# Patient Record
Sex: Female | Born: 1950 | Race: White | Hispanic: No | Marital: Married | Smoking: Former smoker
Health system: Southern US, Community
[De-identification: ages and names within clinical notes are randomized; demographics above are authoritative.]

## PROBLEM LIST (undated history)

## (undated) DIAGNOSIS — M797 Fibromyalgia: Secondary | ICD-10-CM

## (undated) DIAGNOSIS — I10 Essential (primary) hypertension: Secondary | ICD-10-CM

## (undated) DIAGNOSIS — E119 Type 2 diabetes mellitus without complications: Secondary | ICD-10-CM

## (undated) DIAGNOSIS — K760 Fatty (change of) liver, not elsewhere classified: Secondary | ICD-10-CM

## (undated) DIAGNOSIS — G43909 Migraine, unspecified, not intractable, without status migrainosus: Secondary | ICD-10-CM

## (undated) DIAGNOSIS — E7800 Pure hypercholesterolemia, unspecified: Secondary | ICD-10-CM

## (undated) DIAGNOSIS — G629 Polyneuropathy, unspecified: Secondary | ICD-10-CM

## (undated) DIAGNOSIS — M810 Age-related osteoporosis without current pathological fracture: Secondary | ICD-10-CM

## (undated) DIAGNOSIS — R011 Cardiac murmur, unspecified: Secondary | ICD-10-CM

## (undated) DIAGNOSIS — G4730 Sleep apnea, unspecified: Secondary | ICD-10-CM

## (undated) HISTORY — PX: KNEE ARTHROSCOPY: SUR90

## (undated) HISTORY — DX: Polyneuropathy, unspecified: G62.9

## (undated) HISTORY — DX: Fatty (change of) liver, not elsewhere classified: K76.0

## (undated) HISTORY — DX: Fibromyalgia: M79.7

## (undated) HISTORY — PX: TOTAL HIP ARTHROPLASTY: SHX124

## (undated) HISTORY — DX: Age-related osteoporosis without current pathological fracture: M81.0

## (undated) HISTORY — PX: LYMPH NODE BIOPSY: SHX201

## (undated) HISTORY — PX: CHOLECYSTECTOMY: SHX55

## (undated) HISTORY — PX: OTHER SURGICAL HISTORY: SHX169

## (undated) HISTORY — PX: CATARACT EXTRACTION: SUR2

---

## 1980-11-07 HISTORY — PX: CHOLECYSTECTOMY: SHX55

## 2003-11-08 HISTORY — PX: LYMPH NODE BIOPSY: SHX201

## 2016-08-01 DIAGNOSIS — I10 Essential (primary) hypertension: Secondary | ICD-10-CM | POA: Insufficient documentation

## 2016-12-05 DIAGNOSIS — E1165 Type 2 diabetes mellitus with hyperglycemia: Secondary | ICD-10-CM | POA: Insufficient documentation

## 2016-12-06 ENCOUNTER — Other Ambulatory Visit: Payer: Self-pay | Admitting: Pediatrics

## 2016-12-06 DIAGNOSIS — Z1239 Encounter for other screening for malignant neoplasm of breast: Secondary | ICD-10-CM

## 2018-08-23 DIAGNOSIS — M2550 Pain in unspecified joint: Secondary | ICD-10-CM | POA: Insufficient documentation

## 2018-11-13 DIAGNOSIS — M1990 Unspecified osteoarthritis, unspecified site: Secondary | ICD-10-CM | POA: Insufficient documentation

## 2018-11-27 ENCOUNTER — Ambulatory Visit: Payer: Medicare Other | Admitting: Physical Therapy

## 2018-12-03 ENCOUNTER — Encounter: Payer: Medicare Other | Admitting: Physical Therapy

## 2018-12-05 ENCOUNTER — Encounter: Payer: Medicare Other | Admitting: Physical Therapy

## 2018-12-10 ENCOUNTER — Encounter: Payer: Medicare Other | Admitting: Physical Therapy

## 2018-12-13 ENCOUNTER — Encounter: Payer: Medicare Other | Admitting: Physical Therapy

## 2018-12-17 ENCOUNTER — Encounter: Payer: Medicare Other | Admitting: Physical Therapy

## 2018-12-20 ENCOUNTER — Encounter: Payer: Medicare Other | Admitting: Physical Therapy

## 2018-12-24 ENCOUNTER — Encounter: Payer: Medicare Other | Admitting: Physical Therapy

## 2018-12-27 ENCOUNTER — Encounter: Payer: Medicare Other | Admitting: Physical Therapy

## 2018-12-31 ENCOUNTER — Encounter: Payer: Medicare Other | Admitting: Physical Therapy

## 2019-01-01 ENCOUNTER — Other Ambulatory Visit (HOSPITAL_COMMUNITY): Payer: Self-pay | Admitting: Neurology

## 2019-01-01 ENCOUNTER — Other Ambulatory Visit: Payer: Self-pay | Admitting: Neurology

## 2019-01-01 DIAGNOSIS — M6289 Other specified disorders of muscle: Secondary | ICD-10-CM

## 2019-01-03 ENCOUNTER — Encounter: Payer: Medicare Other | Admitting: Physical Therapy

## 2019-01-09 ENCOUNTER — Ambulatory Visit
Admission: RE | Admit: 2019-01-09 | Discharge: 2019-01-09 | Disposition: A | Discharge status: Home / Self Care | Payer: Medicare Other | Source: Ambulatory Visit | Attending: Neurology | Admitting: Neurology

## 2019-01-09 ENCOUNTER — Other Ambulatory Visit: Payer: Self-pay

## 2019-01-09 DIAGNOSIS — M6289 Other specified disorders of muscle: Secondary | ICD-10-CM | POA: Diagnosis present

## 2019-05-27 DIAGNOSIS — E041 Nontoxic single thyroid nodule: Secondary | ICD-10-CM | POA: Insufficient documentation

## 2019-12-12 ENCOUNTER — Emergency Department: Payer: Medicare Other

## 2019-12-12 ENCOUNTER — Other Ambulatory Visit: Payer: Self-pay

## 2019-12-12 ENCOUNTER — Encounter: Payer: Self-pay | Admitting: Emergency Medicine

## 2019-12-12 ENCOUNTER — Emergency Department
Admission: EM | Admit: 2019-12-12 | Discharge: 2019-12-12 | Disposition: A | Discharge status: Home / Self Care | Payer: Medicare Other | Attending: Emergency Medicine | Admitting: Emergency Medicine

## 2019-12-12 DIAGNOSIS — Z7982 Long term (current) use of aspirin: Secondary | ICD-10-CM | POA: Diagnosis not present

## 2019-12-12 DIAGNOSIS — R221 Localized swelling, mass and lump, neck: Secondary | ICD-10-CM | POA: Insufficient documentation

## 2019-12-12 DIAGNOSIS — T50Z95A Adverse effect of other vaccines and biological substances, initial encounter: Secondary | ICD-10-CM | POA: Diagnosis not present

## 2019-12-12 DIAGNOSIS — Z79899 Other long term (current) drug therapy: Secondary | ICD-10-CM | POA: Insufficient documentation

## 2019-12-12 LAB — BASIC METABOLIC PANEL
Anion gap: 11 (ref 5–15)
BUN: 19 mg/dL (ref 8–23)
CO2: 26 mmol/L (ref 22–32)
Calcium: 10 mg/dL (ref 8.9–10.3)
Chloride: 98 mmol/L (ref 98–111)
Creatinine, Ser: 1 mg/dL (ref 0.44–1.00)
GFR calc Af Amer: >60 mL/min (ref >60)
GFR calc non Af Amer: 58 mL/min — ABNORMAL LOW (ref >60)
Glucose, Bld: 164 mg/dL — ABNORMAL HIGH (ref 70–99)
Potassium: 4.2 mmol/L (ref 3.5–5.1)
Sodium: 135 mmol/L (ref 135–145)

## 2019-12-12 LAB — CBC WITH DIFFERENTIAL/PLATELET
Abs Immature Granulocytes: 0.01 10*3/uL (ref 0.00–0.07)
Basophils Absolute: 0.1 10*3/uL (ref 0.0–0.1)
Basophils Relative: 1 %
Eosinophils Absolute: 0.2 10*3/uL (ref 0.0–0.5)
Eosinophils Relative: 3 %
HCT: 39.5 % (ref 36.0–46.0)
Hemoglobin: 13.6 g/dL (ref 12.0–15.0)
Immature Granulocytes: 0 %
Lymphocytes Relative: 28 %
Lymphs Abs: 1.8 10*3/uL (ref 0.7–4.0)
MCH: 30.9 pg (ref 26.0–34.0)
MCHC: 34.4 g/dL (ref 30.0–36.0)
MCV: 89.8 fL (ref 80.0–100.0)
Monocytes Absolute: 0.4 10*3/uL (ref 0.1–1.0)
Monocytes Relative: 7 %
Neutro Abs: 4 10*3/uL (ref 1.7–7.7)
Neutrophils Relative %: 61 %
Platelets: 213 10*3/uL (ref 150–400)
RBC: 4.4 MIL/uL (ref 3.87–5.11)
RDW: 13.5 % (ref 11.5–15.5)
WBC: 6.4 10*3/uL (ref 4.0–10.5)
nRBC: 0 % (ref 0.0–0.2)

## 2019-12-12 MED ORDER — DEXAMETHASONE SODIUM PHOSPHATE 10 MG/ML IJ SOLN: 10.0000 mg | Freq: Once | INTRAMUSCULAR | Status: DC

## 2019-12-12 NOTE — ED Provider Notes (Signed)
[facility] Emergency Department Provider Note  ____________________________________________   First MD Initiated Contact with Patient 12/12/19 1625     (approximate)  I have reviewed the triage vital signs and the nursing notes.   HISTORY  Chief Complaint Allergic Reaction    HPI Brittany Cervantes is a 69 y.o. female presents emergency department stating that she may be having a reaction to the Covid vaccine.  Patient had a Covid vaccine yesterday.  She is currently taking the antiviral, antibiotic for a eye infection.  She states that today of the left side of her neck is swollen.  She states she had a similar type reaction to anesthesia.  She denies any tongue swelling, difficulty breathing, or rash.  She did take Benadryl which is helped a little.  Her regular doctor told her to come to ED for evaluation.    History reviewed. No pertinent past medical history.  There are no problems to display for this patient.   History reviewed. No pertinent surgical history.  Prior to Admission medications   Medication Sig Start Date End Date Taking? Authorizing Provider  amLODipine (NORVASC) 5 MG tablet Take 5 mg by mouth daily.   Yes [provider]  amoxicillin-clavulanate (AUGMENTIN) 875-125 MG tablet Take 1 tablet by mouth 2 (two) times daily.   Yes [provider]  aspirin EC 81 MG tablet Take 81 mg by mouth daily.   Yes [provider]  azelastine (ASTELIN) 0.1 % nasal spray Place 1 spray into both nostrils 2 (two) times daily. Use in each nostril as directed   Yes [provider]  furosemide (LASIX) 20 MG tablet Take 20 mg by mouth.   Yes [provider]  gabapentin (NEURONTIN) 300 MG capsule Take 300 mg by mouth 3 (three) times daily.   Yes [provider]  glimepiride (AMARYL) 4 MG tablet Take 4 mg by mouth daily with breakfast.   Yes [provider]  hydrochlorothiazide (HYDRODIURIL) 25 MG  tablet Take 25 mg by mouth daily.   Yes [provider]    Allergies Contrast media [iodinated diagnostic agents] and Duloxetine  No family history on file.  Social History Social History   Tobacco Use  . Smoking status: Not on file  Substance Use Topics  . Alcohol use: Not on file  . Drug use: Not on file    Review of Systems  Constitutional: No fever/chills Eyes: No visual changes. ENT: No sore throat.  Left side of neck swelling Respiratory: Denies cough Cardiovascular: Denies chest pain Gastrointestinal: Denies abdominal pain Genitourinary: Negative for dysuria. Musculoskeletal: Negative for back pain. Skin: Negative for rash. Psychiatric: no mood changes,     ____________________________________________   PHYSICAL EXAM:  VITAL SIGNS: ED Triage Vitals  Enc Vitals Group     BP 12/12/19 1613 (!) 148/70     Pulse Rate 12/12/19 1613 62     Resp 12/12/19 1613 17     Temp 12/12/19 1613 97.8 F (36.6 C)     Temp Source 12/12/19 1613 Oral     SpO2 12/12/19 1613 96 %     Weight 12/12/19 1612 215 lb (97.5 kg)     Height 12/12/19 1612 5\' 8"  (1.727 m)     Head Circumference --      Peak Flow --      Pain Score 12/12/19 1621 0     Pain Loc --      Pain Edu? --      Excl.  in [place]? --     Constitutional: Alert and oriented. Well appearing and in no acute distress. Eyes: Conjunctivae are normal.  Head: Atraumatic. Nose: No congestion/rhinnorhea. Mouth/Throat: Mucous membranes are moist.  Lips are normal sized, patent airway Neck:  supple no lymphadenopathy noted, enlargement noted in the soft tissues of the left side of the neck, no rash Cardiovascular: Normal rate, regular rhythm. Heart sounds are normal Respiratory: Normal respiratory effort.  No retractions, lungs c t a  GU: deferred Musculoskeletal: FROM all extremities, warm and well perfused Neurologic:  Normal speech and language.  Skin:  Skin is warm, dry and intact. No rash noted. Psychiatric:  Mood and affect are normal. Speech and behavior are normal.  ____________________________________________   LABS (all labs ordered are listed, but only abnormal results are displayed)  Labs Reviewed  BASIC METABOLIC PANEL - Abnormal; Notable for the following components:      Result Value   Glucose, Bld 164 (*)    GFR calc non Af Amer 58 (*)    All other components within normal limits  CBC WITH DIFFERENTIAL/PLATELET   ____________________________________________   ____________________________________________  RADIOLOGY  CT soft tissue neck without contrast as patient has a contrast allergy is negative  ____________________________________________   PROCEDURES  Procedure(s) performed: No  Procedures    ____________________________________________   INITIAL IMPRESSION / ASSESSMENT AND PLAN / ED COURSE  Pertinent labs & imaging results that were available during my care of the patient were reviewed by me and considered in my medical decision making (see chart for details).   The patient 69 year old female presents emergency department with concerns of a reaction to the Covid vaccine.  See HPI  Physical exam shows the left side of the neck to be swollen.  Explained the findings to the patient.  I do feel that a CT soft tissue of the neck would be appropriate at this time to rule out Ludwig's angina.  CBC is normal, basic metabolic panel has increased glucose of 164, patient is diabetic so this is her norm  CT soft tissue of the neck without contrast is negative  I explained the findings to the patient.  Patient prefers not to do Decadron as she feels that the Benadryl is working.  States the size of the swelling has decreased since she took 50 mg of Benadryl prior to arrival.  I agree that less is better.  Feel that if she is comfortable taking Benadryl this would be appropriate at this time.  Patient was given strict instructions to return if she is worsening.   Explained to her that she may want to discuss this with her regular doctor prior to get the second part of the vaccine.  She states she understands and will comply.  She was discharged in stable condition.    Brittany Cervantes was evaluated in Emergency Department on 12/12/2019 for the symptoms described in the history of present illness. She was evaluated in the context of the global COVID-19 pandemic, which necessitated consideration that the patient might be at risk for infection with the SARS-CoV-2 virus that causes COVID-19. Institutional protocols and algorithms that pertain to the evaluation of patients at risk for COVID-19 are in a state of rapid change based on information released by regulatory bodies including the CDC and federal and state organizations. These policies and algorithms were followed during the patient's care in the ED.   As part of my medical decision making, I reviewed the following data within the [facility]  Nursing notes reviewed and incorporated, Labs reviewed see above, Old chart reviewed, Radiograph reviewed CT negative, Notes from prior ED visits and [place] Controlled Substance Database  ____________________________________________   FINAL CLINICAL IMPRESSION(S) / ED DIAGNOSES  Final diagnoses:  Neck swelling  Adverse effect of vaccine, initial encounter      NEW MEDICATIONS STARTED DURING THIS VISIT:  New Prescriptions   No medications on file     Note:  This document was prepared using Dragon voice recognition software and may include unintentional dictation errors.    Versie Starks, PA-C 12/12/19 1952    Harvest Dark, MD 12/12/19 [phone]

## 2019-12-12 NOTE — ED Notes (Signed)
Pt states that she has had swelling to her left side of neck and into her face, but that she took a benadryl and the swelling has improved.

## 2019-12-12 NOTE — ED Triage Notes (Signed)
Pt presents to ED states Covid vaccine yesterday, states last night felt like swelling to her neck and L side of her face. Pt states "the pouch" to her L side of her neck/chin area is normal. Pt states "it didn't look swollen but it felt swollen". Pt states relief of symptoms with benadryl.

## 2019-12-12 NOTE — ED Triage Notes (Signed)
Patient presents to the ED with slight swelling to the left side of her neck.  Patient had the covid19 vaccine yesterday.  Patient denies difficulty speaking or swallowing.  Patient took 50mg  of benadryl prior to arrival.  Patient states she feels like swelling has been getting slowly worse over the course of the day.

## 2019-12-13 ENCOUNTER — Other Ambulatory Visit: Payer: Self-pay

## 2019-12-13 ENCOUNTER — Emergency Department
Admission: EM | Admit: 2019-12-13 | Discharge: 2019-12-13 | Disposition: A | Discharge status: Home / Self Care | Payer: Medicare Other | Attending: Student | Admitting: Student

## 2019-12-13 DIAGNOSIS — Z7984 Long term (current) use of oral hypoglycemic drugs: Secondary | ICD-10-CM | POA: Insufficient documentation

## 2019-12-13 DIAGNOSIS — Z79899 Other long term (current) drug therapy: Secondary | ICD-10-CM | POA: Diagnosis not present

## 2019-12-13 DIAGNOSIS — I10 Essential (primary) hypertension: Secondary | ICD-10-CM | POA: Diagnosis not present

## 2019-12-13 DIAGNOSIS — E119 Type 2 diabetes mellitus without complications: Secondary | ICD-10-CM | POA: Diagnosis not present

## 2019-12-13 DIAGNOSIS — Z7982 Long term (current) use of aspirin: Secondary | ICD-10-CM | POA: Insufficient documentation

## 2019-12-13 DIAGNOSIS — R1310 Dysphagia, unspecified: Secondary | ICD-10-CM

## 2019-12-13 HISTORY — DX: Pure hypercholesterolemia, unspecified: E78.00

## 2019-12-13 HISTORY — DX: Sleep apnea, unspecified: G47.30

## 2019-12-13 HISTORY — DX: Cardiac murmur, unspecified: R01.1

## 2019-12-13 HISTORY — DX: Essential (primary) hypertension: I10

## 2019-12-13 HISTORY — DX: Type 2 diabetes mellitus without complications: E11.9

## 2019-12-13 HISTORY — DX: Migraine, unspecified, not intractable, without status migrainosus: G43.909

## 2019-12-13 MED ORDER — SODIUM CHLORIDE 0.9 % IV SOLN: 40.0000 mg | Freq: Once | INTRAVENOUS | Status: DC

## 2019-12-13 MED ORDER — FAMOTIDINE IN NACL 20-0.9 MG/50ML-% IV SOLN
20.0000 mg | Freq: Once | INTRAVENOUS | Status: AC
  Administered 2019-12-13: 20 mg via INTRAVENOUS
  Filled 2019-12-13: qty 50

## 2019-12-13 MED ORDER — PREDNISONE 20 MG PO TABS: 40.0000 mg | ORAL_TABLET | Freq: Every day | ORAL | 0 refills | Status: AC

## 2019-12-13 MED ORDER — METHYLPREDNISOLONE SODIUM SUCC 125 MG IJ SOLR
125.0000 mg | Freq: Once | INTRAMUSCULAR | Status: AC
  Administered 2019-12-13: 125 mg via INTRAVENOUS
  Filled 2019-12-13: qty 2

## 2019-12-13 NOTE — ED Notes (Signed)
MD at bedside. 

## 2019-12-13 NOTE — Discharge Instructions (Signed)
Thank you for letting us take care of you in the emergency department today.   Please continue to take any regular, prescribed medications.   New medications we have prescribed:  - Prednisone steroid burst. Keep an eye on your sugars while on this.  - Continue to take scheduled Benadryl for the next few days.  Please follow up with: - Your primary care doctor to review your ER visit and follow up on your symptoms.   Please return to the ER for any new or worsening symptoms.

## 2019-12-13 NOTE — ED Triage Notes (Addendum)
Pt comes via POV from home with c/o allergic reaction. Pt states she had her 1st vaccine on Wednesday around 4pm.  Pt states not long after she started to experience swelling around her mouth and throat.  Pt seen here in ED yesterday and sent home. Pt states she has been taking benadryl around the clock with no relief. Pt states she is starting to have trouble swallowing. Pt also states rash that she noticed this am on her right upper chest area. Pt states she feels shakey.

## 2019-12-13 NOTE — ED Provider Notes (Signed)
[facility] Emergency Department Provider Note  ____________________________________________   First MD Initiated Contact with Patient 12/13/19 1145     (approximate)  I have reviewed the triage vital signs and the nursing notes.  History  Chief Complaint Allergic Reaction    HPI Brittany Cervantes is a 69 y.o. female who presents to the emergency department with concern for possible allergic reaction to the COVID vaccine.  Patient states she received the vaccine on Wednesday.  Shortly afterward she felt like she had left-sided facial and neck swelling.  She was seen here, treated with Benadryl PTA, CT neck was negative, felt subjective improvement, and was discharged on a scheduled Benadryl regimen.   This morning when she woke up she felt like she was improving, however around 11 AM she began to feel like she had some trouble with swallowing.  She states she is able to swallow but that it feels different.  She denies any lip or tongue swelling.  No wheezing.  No nausea, vomiting, diarrhea.  No rash.  No difficulty breathing or speaking.  No voice changes.  She does report a number of allergies including allergies to prior anesthetics given in surgery with some similar reaction.   Past Medical Hx Past Medical History:  Diagnosis Date  . Diabetes mellitus without complication ([place])   . Heart murmur   . Hypercholesteremia   . Hypertension   . Migraines   . Sleep apnea     Problem List There are no problems to display for this patient.   Past Surgical Hx Prior incision site to the RIGHT submandibular/upper neck area.  Medications Prior to Admission medications   Medication Sig Start Date End Date Taking? Authorizing Provider  amLODipine (NORVASC) 5 MG tablet Take 5 mg by mouth daily.    [provider]  amoxicillin-clavulanate (AUGMENTIN) 875-125 MG tablet Take 1 tablet by mouth 2 (two) times daily.    [provider]  aspirin EC 81 MG  tablet Take 81 mg by mouth daily.    [provider]  azelastine (ASTELIN) 0.1 % nasal spray Place 1 spray into both nostrils 2 (two) times daily. Use in each nostril as directed    [provider]  furosemide (LASIX) 20 MG tablet Take 20 mg by mouth.    [provider]  gabapentin (NEURONTIN) 300 MG capsule Take 300 mg by mouth 3 (three) times daily.    [provider]  glimepiride (AMARYL) 4 MG tablet Take 4 mg by mouth daily with breakfast.    [provider]  hydrochlorothiazide (HYDRODIURIL) 25 MG tablet Take 25 mg by mouth daily.    [provider]    Allergies Contrast media [iodinated diagnostic agents] and Duloxetine  Family Hx No family history on file.  Social Hx Social History   Tobacco Use  . Smoking status: Not on file  Substance Use Topics  . Alcohol use: Not on file  . Drug use: Not on file     Review of Systems  Constitutional: Negative for fever, chills. Eyes: Negative for visual changes. ENT: Negative for sore throat. Cardiovascular: Negative for chest pain. Respiratory: Negative for shortness of breath. Gastrointestinal: Negative for nausea, vomiting. + trouble swallowing Genitourinary: Negative for dysuria. Musculoskeletal: Negative for leg swelling. Skin: Negative for rash. Neurological: Negative for headaches.   Physical Exam  Vital Signs: ED Triage Vitals  Enc Vitals Group     BP 12/13/19 1206 (!) 156/77     Pulse --  Resp --      Temp --      Temp src --      SpO2 12/13/19 1210 98 %     Weight 12/13/19 1148 215 lb (97.5 kg)     Height 12/13/19 1148 5\' 8"  (1.727 m)     Head Circumference --      Peak Flow --      Pain Score 12/13/19 1148 0     Pain Loc --      Pain Edu? --      Excl. in [place]? --     Constitutional: Alert and oriented.  Head: Normocephalic. Atraumatic. Eyes: Conjunctivae clear. Sclera anicteric. Nose: No congestion. No rhinorrhea. Mouth/Throat: Wearing mask.   No lip, tongue, uvular swelling.  No voice changes. No tongue elevation. No sublingual swelling. Neck: No stridor.  Asymmetry to the submandibular and upper neck tissue with more prominence on the LEFT, however this is in the setting of a prior submandibular surgical incision site on the RIGHT, which the patient states has given her chronic asymmetry since then.  No lymphadenopathy. Cardiovascular: Normal rate, regular rhythm. Extremities well perfused.  No wheezing. Respiratory: Normal respiratory effort.  Lungs CTAB. Gastrointestinal: Soft. Non-tender. Non-distended.  Musculoskeletal: No lower extremity edema. No deformities. Neurologic:  Normal speech and language. No gross focal neurologic deficits are appreciated. CN II-XII in tact.   Skin: Skin is warm, dry and intact. No rash noted.  No urticaria or hives. Psychiatric: Mood and affect are appropriate for situation.     Procedures  Procedure(s) performed (including critical care):  Procedures   Initial Impression / Assessment and Plan / ED Course  69 y.o. female who presents to the ED with concern for possible allergic reaction to vaccination on Wednesday.  Reports feeling changes in swallowing sensation.  On exam there is no evidence of lip, tongue, uvular swelling.  She exhibits no voice changes, stridor, wheezing, or rash. No evidence of infection. No evidence of facial palsy.  No evidence of anaphylaxis at this time.  Will give dose of steroids and Pepcid to complete allergy cocktail, patient is agreeable.  We will continue to monitor.  If remains stable, anticipate discharge with continued scheduled Benadryl and short steroid burst.  Patient remained stable after period of observation of approximately 3 hours.  No changes in exam.  She reports improvement in her symptoms.  As such, she is stable for discharge.  She is agreeable to a short Rx for steroids (advised monitoring her glucose during this time), scheduled Benadryl for  the next 24 to 48 hours.  Advised PCP follow-up.  Given return precautions.   Final Clinical Impression(s) / ED Diagnosis  Final diagnoses:  Swallowing problem       Note:  This document was prepared using Dragon voice recognition software and may include unintentional dictation errors.   Lilia Pro., MD 12/13/19 [phone]

## 2019-12-13 NOTE — ED Notes (Signed)
Pt states she had similar swelling following surgery x 2 years ago.

## 2020-11-12 ENCOUNTER — Other Ambulatory Visit: Payer: Self-pay

## 2020-11-12 ENCOUNTER — Ambulatory Visit
Admission: RE | Admit: 2020-11-12 | Discharge: 2020-11-12 | Disposition: A | Discharge status: Home / Self Care | Payer: Medicare Other | Source: Ambulatory Visit | Attending: Emergency Medicine | Admitting: Emergency Medicine

## 2020-11-12 VITALS — BP 124/62 | HR 60 | Temp 99.0°F | Resp 14 | Ht 68.0 in | Wt 220.0 lb

## 2020-11-12 DIAGNOSIS — Z20822 Contact with and (suspected) exposure to covid-19: Secondary | ICD-10-CM | POA: Insufficient documentation

## 2020-11-12 DIAGNOSIS — J069 Acute upper respiratory infection, unspecified: Secondary | ICD-10-CM | POA: Insufficient documentation

## 2020-11-12 LAB — SARS CORONAVIRUS 2 (TAT 6-24 HRS): SARS Coronavirus 2: NEGATIVE

## 2020-11-12 NOTE — Discharge Instructions (Addendum)
Quarantine for the next 4 days until you reach the 10-day mark.  Since your symptoms are continuing to improve and you have not had a fever after the 10 days you can break quarantine providing you are continuing to feel better.  You can continue to use Mucinex to help you expectorate mucus.  Use Tylenol and ibuprofen as needed for pain.  If you develop any worsening of symptoms, especially shortness of breath at rest, you cannot speak in full sentences, worried about blowing of your lips she need to go to the ER for evaluation.

## 2020-11-12 NOTE — ED Triage Notes (Signed)
Patient states that she has been having fever, diarrhea, fatigue, nasal congestion and runny nose x 6 days.   States that husband is positive for covid.

## 2020-11-12 NOTE — ED Provider Notes (Signed)
[facility]    CSN: [id number] Arrival date & time: 11/12/20  1142      History   Chief Complaint Chief Complaint  Patient presents with  . Appointment    [phone]    HPI Brittany Cervantes is a 70 y.o. female.   HPI   70 year old female here for evaluation of COVID symptoms.  Patient reports that 6 days ago she developed fever, diarrhea, runny nose and nasal congestion and fatigue.  Additionally she has had a headache, intermittent nonproductive cough, sinus pain and pressure, and some intermittent sweats.  Patient denies body aches, changes to her sense of taste or smell, shortness of breath or wheezing, ear pain or pressure or, sore throat.  Patient reports that all of her symptoms are resolving and her nasal congestion runny nose however resolved.  Past Medical History:  Diagnosis Date  . Diabetes mellitus without complication ([facility])   . Heart murmur   . Hypercholesteremia   . Hypertension   . Migraines   . Sleep apnea     There are no problems to display for this patient.   History reviewed. No pertinent surgical history.  OB History   No obstetric history on file.      Home Medications    Prior to Admission medications   Medication Sig Start Date End Date Taking? Authorizing Provider  amLODipine (NORVASC) 5 MG tablet Take 5 mg by mouth daily.   Yes [provider]  ascorbic acid (VITAMIN C) 1000 MG tablet Take by mouth.   Yes [provider]  aspirin EC 81 MG tablet Take 81 mg by mouth daily.   Yes [provider]  azelastine (ASTELIN) 0.1 % nasal spray Place 1 spray into both nostrils 2 (two) times daily. Use in each nostril as directed   Yes [provider]  citalopram (CELEXA) 10 MG tablet Take by mouth. 08/29/20 11/05/21 Yes [provider]  ferrous gluconate (FERGON) 324 MG tablet Take by mouth.   Yes [provider]  furosemide (LASIX) 20 MG tablet Take 20 mg by mouth.   Yes [provider]  gabapentin (NEURONTIN) 300 MG capsule Take 300 mg by mouth 3 (three) times daily.   Yes [provider]  glimepiride (AMARYL) 4 MG tablet Take 4 mg by mouth daily with breakfast.   Yes [provider]  hydrochlorothiazide (HYDRODIURIL) 25 MG tablet Take 25 mg by mouth daily.   Yes [provider]  Insulin Glargine (BASAGLAR KWIKPEN) 100 UNIT/ML Inject into the skin. 05/26/20  Yes [provider]  metFORMIN (GLUCOPHAGE) 1000 MG tablet Take 1 tablet by mouth 2 (two) times daily with a meal. 05/03/18  Yes [provider]  metoprolol tartrate (LOPRESSOR) 25 MG tablet Take by mouth. 03/05/18 09/02/21 Yes [provider]  rosuvastatin (CRESTOR) 20 MG tablet Take by mouth. 05/04/20  Yes [provider]  spironolactone (ALDACTONE) 25 MG tablet Take by mouth. 06/16/20  Yes [provider]    Family History History reviewed. No pertinent family history.  Social History Social History   Tobacco Use  . Smoking status: Never Smoker  . Smokeless tobacco: Never Used  Vaping Use  . Vaping Use: Never used  Substance Use Topics  . Alcohol use: Never  . Drug use: Never     Allergies   Covid-19 (mrna) vaccine, Losartan, Other, Contrast media [iodinated diagnostic agents], and Duloxetine   Review of Systems Review of Systems  Constitutional: Positive for diaphoresis, fatigue and fever.  Negative for activity change and appetite change.  HENT: Positive for congestion, rhinorrhea and sinus pressure. Negative for ear pain.   Respiratory: Positive for cough. Negative for shortness of breath and wheezing.   Gastrointestinal: Positive for diarrhea. Negative for nausea and vomiting.  Musculoskeletal: Negative for arthralgias and myalgias.  Skin: Negative for rash.  Neurological: Positive for headaches.  Hematological: Negative.   Psychiatric/Behavioral: Negative.      Physical Exam Triage Vital Signs ED Triage  Vitals [11/12/20 1209]  Enc Vitals Group     BP      Pulse      Resp      Temp      Temp src      SpO2      Weight 220 lb (99.8 kg)     Height 5\' 8"  (1.727 m)     Head Circumference      Peak Flow      Pain Score 3     Pain Loc      Pain Edu?      Excl. in [place]?    No data found.  Updated Vital Signs BP 124/62 (BP Location: Right Arm)   Pulse 60   Temp 99 F (37.2 C) (Oral)   Resp 14   Ht 5\' 8"  (1.727 m)   Wt 220 lb (99.8 kg)   SpO2 98%   BMI 33.45 kg/m   Visual Acuity Right Eye Distance:   Left Eye Distance:   Bilateral Distance:    Right Eye Near:   Left Eye Near:    Bilateral Near:     Physical Exam Vitals and nursing note reviewed.  Constitutional:      General: She is not in acute distress.    Appearance: Normal appearance. She is normal weight. She is not toxic-appearing.  HENT:     Head: Normocephalic and atraumatic.     Right Ear: Tympanic membrane, ear canal and external ear normal.     Left Ear: Tympanic membrane, ear canal and external ear normal.     Nose: Congestion and rhinorrhea present.     Mouth/Throat:     Pharynx: Oropharynx is clear. Posterior oropharyngeal erythema present.  Cardiovascular:     Rate and Rhythm: Normal rate.     Pulses: Normal pulses.     Heart sounds: Normal heart sounds. No murmur heard.   Pulmonary:     Effort: Pulmonary effort is normal.     Breath sounds: Normal breath sounds. No wheezing, rhonchi or rales.  Musculoskeletal:     Cervical back: Normal range of motion and neck supple.  Lymphadenopathy:     Cervical: Cervical adenopathy present.  Skin:    General: Skin is warm and dry.     Capillary Refill: Capillary refill takes less than 2 seconds.     Findings: No erythema or rash.  Neurological:     General: No focal deficit present.     Mental Status: She is alert and oriented to person, place, and time.  Psychiatric:        Mood and Affect: Mood normal.        Behavior: Behavior normal.         Thought Content: Thought content normal.        Judgment: Judgment normal.      UC Treatments / Results  Labs (all labs ordered are listed, but only abnormal results are displayed) Labs Reviewed  SARS CORONAVIRUS 2 (TAT 6-24 HRS)    EKG  Radiology No results found.  Procedures Procedures (including critical care time)  Medications Ordered in UC Medications - No data to display  Initial Impression / Assessment and Plan / UC Course  I have reviewed the triage vital signs and the nursing notes.  Pertinent labs & imaging results that were available during my care of the patient were reviewed by me and considered in my medical decision making (see chart for details).   Patient is here for evaluation of COVID-like symptoms that started 6 days ago and are improving.  Physical exam reveals some erythematous edematous nasal mucosa with clear nasal discharge.  Posterior oropharynx is mildly erythematous with some clear postnasal drip.  Shotty bilateral anterior cervical lymphadenopathy is present.  Lungs clear to auscultation all fields.  Patient has been taking over-the-counter Mucinex and it has been helping her to expectorate some clear mucus.  Patient is in no acute distress.  Patient only received 1 dose of Pfizer vaccine because she had an anaphylactic reaction to it.  Patient has had her flu vaccine.    We will send COVID swab charge patient home with supportive care.   Final Clinical Impressions(s) / UC Diagnoses   Final diagnoses:  Viral URI with cough     Discharge Instructions     Quarantine for the next 4 days until you reach the 10-day mark.  Since your symptoms are continuing to improve and you have not had a fever after the 10 days you can break quarantine providing you are continuing to feel better.  You can continue to use Mucinex to help you expectorate mucus.  Use Tylenol and ibuprofen as needed for pain.  If you develop any worsening of symptoms,  especially shortness of breath at rest, you cannot speak in full sentences, worried about blowing of your lips she need to go to the ER for evaluation.    ED Prescriptions    None     PDMP not reviewed this encounter.   Margarette Canada, NP 11/12/20 1248

## 2020-11-18 DIAGNOSIS — J301 Allergic rhinitis due to pollen: Secondary | ICD-10-CM | POA: Insufficient documentation

## 2020-11-18 DIAGNOSIS — K219 Gastro-esophageal reflux disease without esophagitis: Secondary | ICD-10-CM | POA: Insufficient documentation

## 2020-12-18 IMAGING — CT CT NECK W/O CM
4 of 5 series · 14 of 33 positions shown, 16 images · non-contrast
Comparison: None.

CLINICAL DATA: Left-sided neck swelling

EXAM:
CT NECK WITHOUT CONTRAST
TECHNIQUE: Multidetector CT imaging of the neck was performed following the
standard protocol without intravenous contrast.

[Series 5: sag neck · axial · 0.39mm/px · z∈[-292,-170]mm · 3 of 129 slices shown, 4 images]
[im 33/129  soft-tissue]
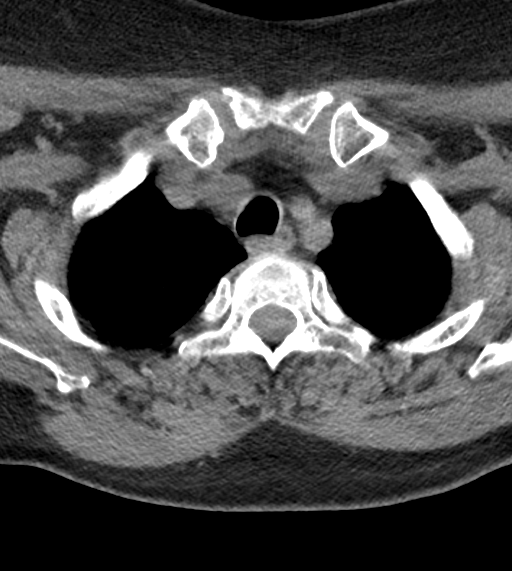
[im 33/129  bone]
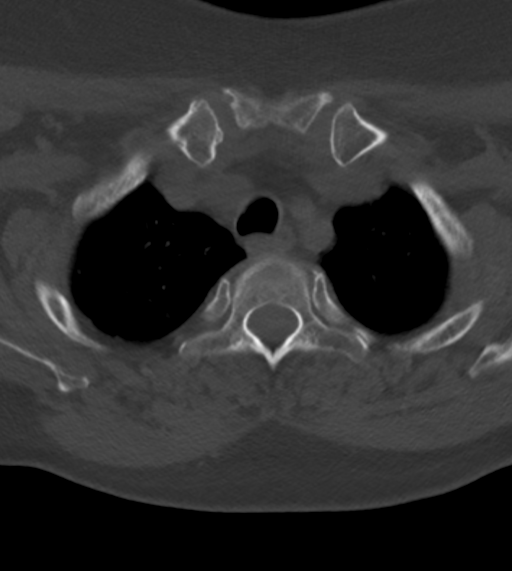
[im 65/129  bone]
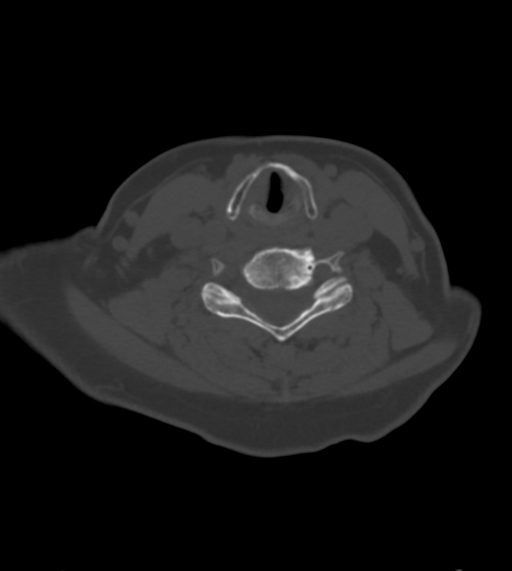
[im 97/129  bone]
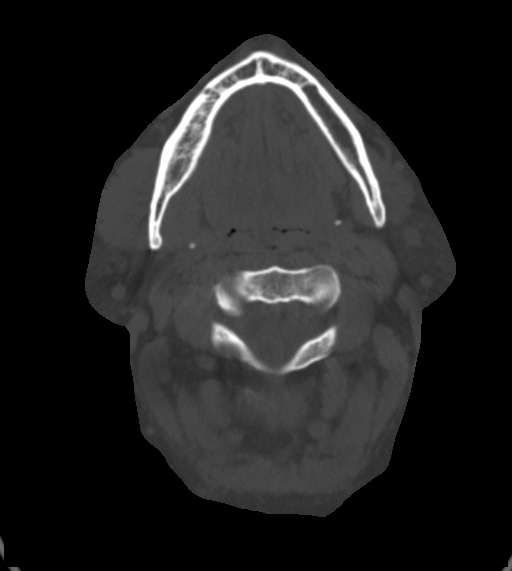

[Series 6: cor neck · coronal · 0.39mm/px · 3 of 112 slices shown]
[im 31/112  bone]
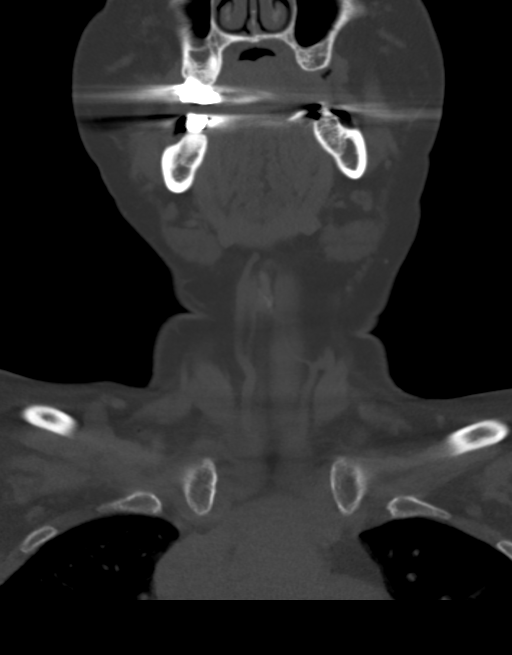
[im 48/112  bone]
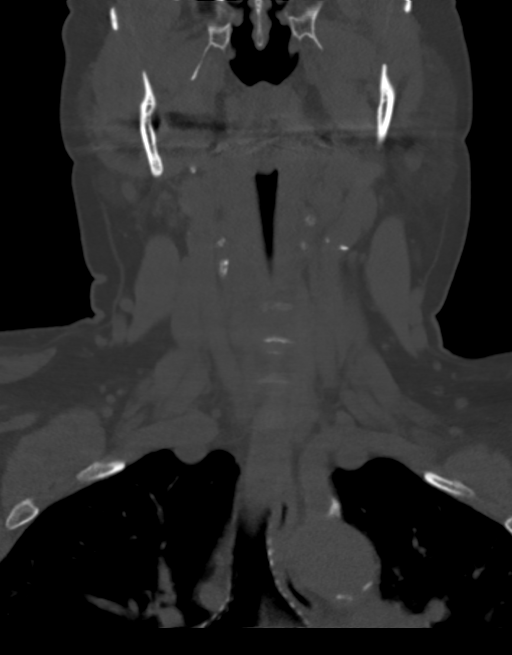
[im 64/112  bone]
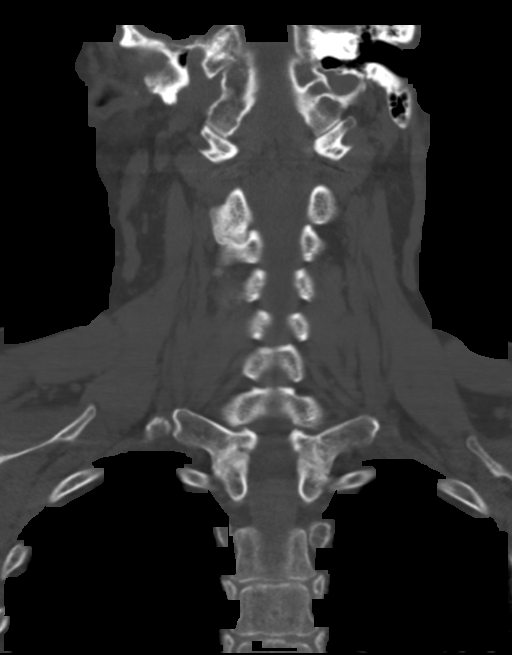

[Series 7: orthogonal ax · axial · 0.39mm/px · z∈[-292,-170]mm · 3 of 129 slices shown]
[im 33/129  bone]
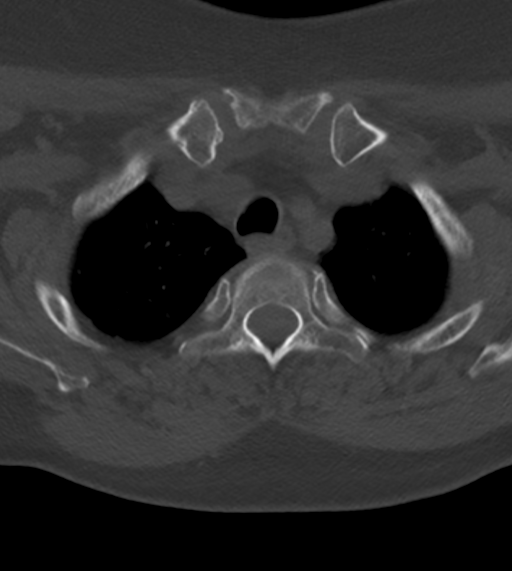
[im 65/129  bone]
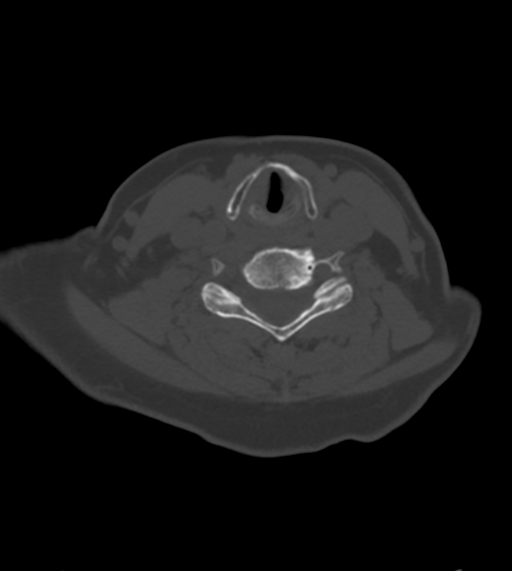
[im 97/129  bone]
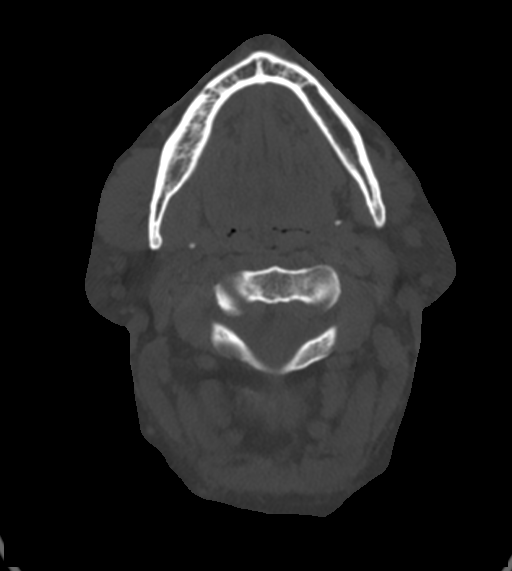

[Series 8: sag (id) · sagittal · 0.44mm/px · 5 of 101 slices shown, 6 images]
[im 34/101  bone]
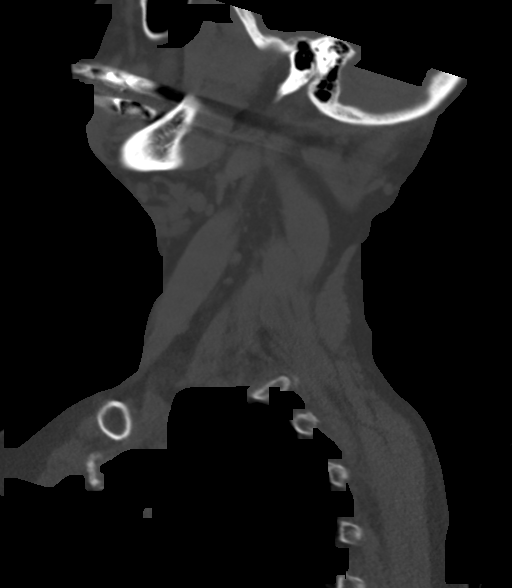
[im 42/101  bone]
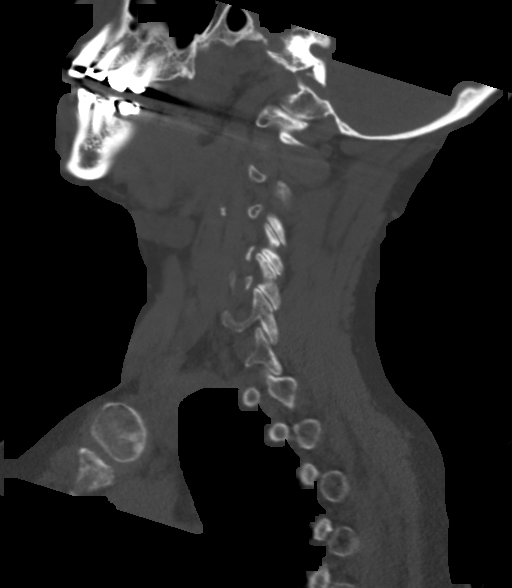
[im 51/101  soft-tissue]
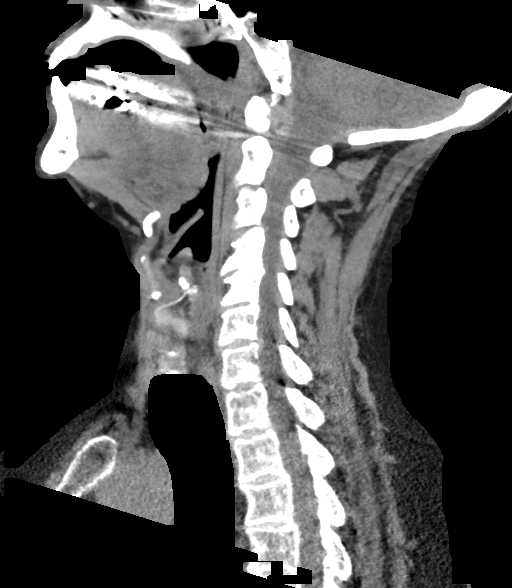
[im 51/101  bone]
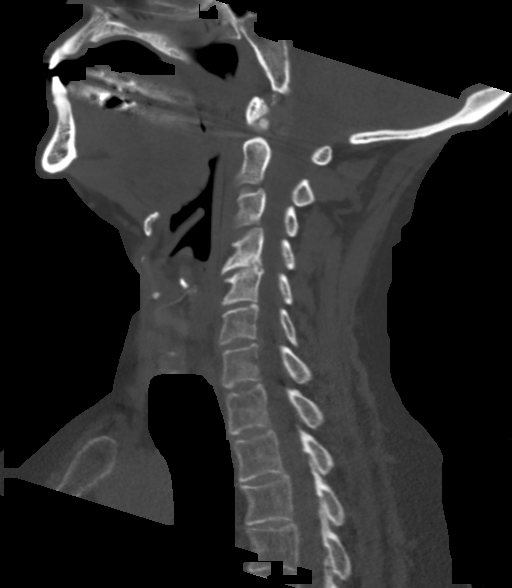
[im 59/101  bone]
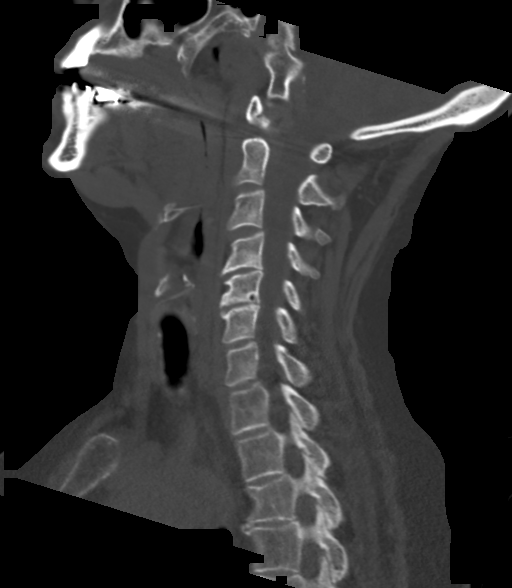
[im 67/101  bone]
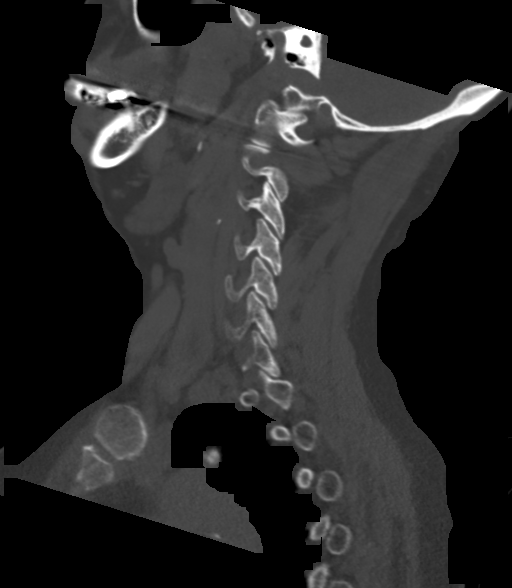

[14 of 33 positions shown; findings below may reference images not displayed]

FINDINGS: Pharynx and larynx: Prominent tonsils and adenoid bilaterally
without focal mass. No abscess. Tonsilliths bilaterally. Epiglottis
and vocal cords normal.

Salivary glands: No inflammation, mass, or stone.

Thyroid: Negative

Lymph nodes: No enlarged lymph nodes in the neck.

Vascular: Mild carotid artery calcification bilaterally. Limited
vascular evaluation without intravenous contrast.

Limited intracranial: Negative

Visualized orbits: Not imaged

Mastoids and visualized paranasal sinuses: Negative

Skeleton: No acute skeletal abnormality. Mild degenerative change in
the cervical spine.

Upper chest: Lung apices clear bilaterally.

Other: No significant soft tissue edema.
IMPRESSION: Hypertrophic tonsils and adenoid bilaterally likely due to
hypertrophy. No focal mass or abscess.

No adenopathy in the neck.  No soft tissue swelling.

## 2021-03-03 DIAGNOSIS — R001 Bradycardia, unspecified: Secondary | ICD-10-CM | POA: Insufficient documentation

## 2021-03-29 DIAGNOSIS — G4733 Obstructive sleep apnea (adult) (pediatric): Secondary | ICD-10-CM | POA: Insufficient documentation

## 2021-05-11 DIAGNOSIS — E119 Type 2 diabetes mellitus without complications: Secondary | ICD-10-CM | POA: Insufficient documentation

## 2021-05-11 DIAGNOSIS — K760 Fatty (change of) liver, not elsewhere classified: Secondary | ICD-10-CM | POA: Insufficient documentation

## 2021-05-11 DIAGNOSIS — F3342 Major depressive disorder, recurrent, in full remission: Secondary | ICD-10-CM | POA: Insufficient documentation

## 2021-11-09 DIAGNOSIS — I10 Essential (primary) hypertension: Secondary | ICD-10-CM | POA: Diagnosis not present

## 2021-11-09 DIAGNOSIS — Z794 Long term (current) use of insulin: Secondary | ICD-10-CM | POA: Diagnosis not present

## 2021-11-09 DIAGNOSIS — E1165 Type 2 diabetes mellitus with hyperglycemia: Secondary | ICD-10-CM | POA: Diagnosis not present

## 2021-11-11 DIAGNOSIS — F3342 Major depressive disorder, recurrent, in full remission: Secondary | ICD-10-CM | POA: Diagnosis not present

## 2021-11-11 DIAGNOSIS — E785 Hyperlipidemia, unspecified: Secondary | ICD-10-CM | POA: Diagnosis not present

## 2021-11-11 DIAGNOSIS — G4733 Obstructive sleep apnea (adult) (pediatric): Secondary | ICD-10-CM | POA: Diagnosis not present

## 2021-11-11 DIAGNOSIS — Z794 Long term (current) use of insulin: Secondary | ICD-10-CM | POA: Diagnosis not present

## 2021-11-11 DIAGNOSIS — E119 Type 2 diabetes mellitus without complications: Secondary | ICD-10-CM | POA: Diagnosis not present

## 2021-11-11 DIAGNOSIS — I10 Essential (primary) hypertension: Secondary | ICD-10-CM | POA: Diagnosis not present

## 2021-11-11 DIAGNOSIS — K760 Fatty (change of) liver, not elsewhere classified: Secondary | ICD-10-CM | POA: Diagnosis not present

## 2021-11-16 DIAGNOSIS — I10 Essential (primary) hypertension: Secondary | ICD-10-CM | POA: Diagnosis not present

## 2022-01-13 DIAGNOSIS — J069 Acute upper respiratory infection, unspecified: Secondary | ICD-10-CM | POA: Diagnosis not present

## 2022-01-13 DIAGNOSIS — Z03818 Encounter for observation for suspected exposure to other biological agents ruled out: Secondary | ICD-10-CM | POA: Diagnosis not present

## 2022-01-20 DIAGNOSIS — L814 Other melanin hyperpigmentation: Secondary | ICD-10-CM | POA: Diagnosis not present

## 2022-01-20 DIAGNOSIS — L820 Inflamed seborrheic keratosis: Secondary | ICD-10-CM | POA: Diagnosis not present

## 2022-01-20 DIAGNOSIS — D2261 Melanocytic nevi of right upper limb, including shoulder: Secondary | ICD-10-CM | POA: Diagnosis not present

## 2022-01-20 DIAGNOSIS — L853 Xerosis cutis: Secondary | ICD-10-CM | POA: Diagnosis not present

## 2022-01-20 DIAGNOSIS — D2271 Melanocytic nevi of right lower limb, including hip: Secondary | ICD-10-CM | POA: Diagnosis not present

## 2022-01-20 DIAGNOSIS — D225 Melanocytic nevi of trunk: Secondary | ICD-10-CM | POA: Diagnosis not present

## 2022-01-20 DIAGNOSIS — L821 Other seborrheic keratosis: Secondary | ICD-10-CM | POA: Diagnosis not present

## 2022-01-20 DIAGNOSIS — L570 Actinic keratosis: Secondary | ICD-10-CM | POA: Diagnosis not present

## 2022-01-20 DIAGNOSIS — D2262 Melanocytic nevi of left upper limb, including shoulder: Secondary | ICD-10-CM | POA: Diagnosis not present

## 2022-01-28 DIAGNOSIS — J069 Acute upper respiratory infection, unspecified: Secondary | ICD-10-CM | POA: Diagnosis not present

## 2022-02-03 DIAGNOSIS — Z794 Long term (current) use of insulin: Secondary | ICD-10-CM | POA: Diagnosis not present

## 2022-02-03 DIAGNOSIS — E1165 Type 2 diabetes mellitus with hyperglycemia: Secondary | ICD-10-CM | POA: Diagnosis not present

## 2022-02-08 DIAGNOSIS — E119 Type 2 diabetes mellitus without complications: Secondary | ICD-10-CM | POA: Diagnosis not present

## 2022-02-08 DIAGNOSIS — Z794 Long term (current) use of insulin: Secondary | ICD-10-CM | POA: Diagnosis not present

## 2022-02-08 DIAGNOSIS — L03116 Cellulitis of left lower limb: Secondary | ICD-10-CM | POA: Diagnosis not present

## 2022-02-08 DIAGNOSIS — L089 Local infection of the skin and subcutaneous tissue, unspecified: Secondary | ICD-10-CM | POA: Diagnosis not present

## 2022-02-08 DIAGNOSIS — S80812A Abrasion, left lower leg, initial encounter: Secondary | ICD-10-CM | POA: Diagnosis not present

## 2022-02-10 DIAGNOSIS — M7731 Calcaneal spur, right foot: Secondary | ICD-10-CM | POA: Diagnosis not present

## 2022-02-10 DIAGNOSIS — M722 Plantar fascial fibromatosis: Secondary | ICD-10-CM | POA: Diagnosis not present

## 2022-02-10 DIAGNOSIS — M79671 Pain in right foot: Secondary | ICD-10-CM | POA: Diagnosis not present

## 2022-02-10 DIAGNOSIS — M79672 Pain in left foot: Secondary | ICD-10-CM | POA: Diagnosis not present

## 2022-02-10 DIAGNOSIS — E119 Type 2 diabetes mellitus without complications: Secondary | ICD-10-CM | POA: Diagnosis not present

## 2022-02-17 DIAGNOSIS — Z1231 Encounter for screening mammogram for malignant neoplasm of breast: Secondary | ICD-10-CM | POA: Diagnosis not present

## 2022-03-01 DIAGNOSIS — Z794 Long term (current) use of insulin: Secondary | ICD-10-CM | POA: Diagnosis not present

## 2022-03-01 DIAGNOSIS — E1129 Type 2 diabetes mellitus with other diabetic kidney complication: Secondary | ICD-10-CM | POA: Diagnosis not present

## 2022-03-17 DIAGNOSIS — M722 Plantar fascial fibromatosis: Secondary | ICD-10-CM | POA: Diagnosis not present

## 2022-03-17 DIAGNOSIS — E119 Type 2 diabetes mellitus without complications: Secondary | ICD-10-CM | POA: Diagnosis not present

## 2022-04-22 DIAGNOSIS — L570 Actinic keratosis: Secondary | ICD-10-CM | POA: Diagnosis not present

## 2022-04-26 DIAGNOSIS — I10 Essential (primary) hypertension: Secondary | ICD-10-CM | POA: Diagnosis not present

## 2022-04-27 DIAGNOSIS — R413 Other amnesia: Secondary | ICD-10-CM | POA: Diagnosis not present

## 2022-04-27 DIAGNOSIS — R2689 Other abnormalities of gait and mobility: Secondary | ICD-10-CM | POA: Diagnosis not present

## 2022-04-27 DIAGNOSIS — M722 Plantar fascial fibromatosis: Secondary | ICD-10-CM | POA: Diagnosis not present

## 2022-04-27 DIAGNOSIS — G4733 Obstructive sleep apnea (adult) (pediatric): Secondary | ICD-10-CM | POA: Diagnosis not present

## 2022-05-16 DIAGNOSIS — Z794 Long term (current) use of insulin: Secondary | ICD-10-CM | POA: Diagnosis not present

## 2022-05-16 DIAGNOSIS — E119 Type 2 diabetes mellitus without complications: Secondary | ICD-10-CM | POA: Diagnosis not present

## 2022-05-18 DIAGNOSIS — S80862A Insect bite (nonvenomous), left lower leg, initial encounter: Secondary | ICD-10-CM | POA: Diagnosis not present

## 2022-05-18 DIAGNOSIS — W57XXXA Bitten or stung by nonvenomous insect and other nonvenomous arthropods, initial encounter: Secondary | ICD-10-CM | POA: Diagnosis not present

## 2022-05-23 DIAGNOSIS — Z0001 Encounter for general adult medical examination with abnormal findings: Secondary | ICD-10-CM | POA: Diagnosis not present

## 2022-05-23 DIAGNOSIS — E1140 Type 2 diabetes mellitus with diabetic neuropathy, unspecified: Secondary | ICD-10-CM | POA: Diagnosis not present

## 2022-05-23 DIAGNOSIS — K760 Fatty (change of) liver, not elsewhere classified: Secondary | ICD-10-CM | POA: Diagnosis not present

## 2022-05-23 DIAGNOSIS — I10 Essential (primary) hypertension: Secondary | ICD-10-CM | POA: Diagnosis not present

## 2022-05-23 DIAGNOSIS — Z794 Long term (current) use of insulin: Secondary | ICD-10-CM | POA: Diagnosis not present

## 2022-05-23 DIAGNOSIS — Z1389 Encounter for screening for other disorder: Secondary | ICD-10-CM | POA: Diagnosis not present

## 2022-05-23 DIAGNOSIS — Z0000 Encounter for general adult medical examination without abnormal findings: Secondary | ICD-10-CM | POA: Diagnosis not present

## 2022-05-23 DIAGNOSIS — G4733 Obstructive sleep apnea (adult) (pediatric): Secondary | ICD-10-CM | POA: Diagnosis not present

## 2022-05-23 DIAGNOSIS — E785 Hyperlipidemia, unspecified: Secondary | ICD-10-CM | POA: Diagnosis not present

## 2022-05-29 DIAGNOSIS — Z23 Encounter for immunization: Secondary | ICD-10-CM | POA: Diagnosis not present

## 2022-05-29 DIAGNOSIS — W57XXXA Bitten or stung by nonvenomous insect and other nonvenomous arthropods, initial encounter: Secondary | ICD-10-CM | POA: Diagnosis not present

## 2022-05-29 DIAGNOSIS — S80862D Insect bite (nonvenomous), left lower leg, subsequent encounter: Secondary | ICD-10-CM | POA: Diagnosis not present

## 2022-06-06 DIAGNOSIS — R202 Paresthesia of skin: Secondary | ICD-10-CM | POA: Diagnosis not present

## 2022-06-06 DIAGNOSIS — R200 Anesthesia of skin: Secondary | ICD-10-CM | POA: Diagnosis not present

## 2022-06-09 DIAGNOSIS — M810 Age-related osteoporosis without current pathological fracture: Secondary | ICD-10-CM | POA: Diagnosis not present

## 2022-06-22 DIAGNOSIS — Z20822 Contact with and (suspected) exposure to covid-19: Secondary | ICD-10-CM | POA: Diagnosis not present

## 2022-07-21 DIAGNOSIS — E119 Type 2 diabetes mellitus without complications: Secondary | ICD-10-CM | POA: Diagnosis not present

## 2022-07-21 DIAGNOSIS — H2513 Age-related nuclear cataract, bilateral: Secondary | ICD-10-CM | POA: Diagnosis not present

## 2022-07-21 DIAGNOSIS — H40013 Open angle with borderline findings, low risk, bilateral: Secondary | ICD-10-CM | POA: Diagnosis not present

## 2022-07-21 DIAGNOSIS — H01119 Allergic dermatitis of unspecified eye, unspecified eyelid: Secondary | ICD-10-CM | POA: Diagnosis not present

## 2022-07-21 DIAGNOSIS — H1045 Other chronic allergic conjunctivitis: Secondary | ICD-10-CM | POA: Diagnosis not present

## 2022-07-21 DIAGNOSIS — H353131 Nonexudative age-related macular degeneration, bilateral, early dry stage: Secondary | ICD-10-CM | POA: Diagnosis not present

## 2022-08-03 DIAGNOSIS — M722 Plantar fascial fibromatosis: Secondary | ICD-10-CM | POA: Diagnosis not present

## 2022-08-03 DIAGNOSIS — R2689 Other abnormalities of gait and mobility: Secondary | ICD-10-CM | POA: Diagnosis not present

## 2022-08-03 DIAGNOSIS — G4733 Obstructive sleep apnea (adult) (pediatric): Secondary | ICD-10-CM | POA: Diagnosis not present

## 2022-08-03 DIAGNOSIS — R278 Other lack of coordination: Secondary | ICD-10-CM | POA: Diagnosis not present

## 2022-08-03 DIAGNOSIS — R413 Other amnesia: Secondary | ICD-10-CM | POA: Diagnosis not present

## 2022-08-03 DIAGNOSIS — R202 Paresthesia of skin: Secondary | ICD-10-CM | POA: Diagnosis not present

## 2022-08-03 DIAGNOSIS — R200 Anesthesia of skin: Secondary | ICD-10-CM | POA: Diagnosis not present

## 2022-08-19 DIAGNOSIS — M722 Plantar fascial fibromatosis: Secondary | ICD-10-CM | POA: Diagnosis not present

## 2022-08-19 DIAGNOSIS — M79671 Pain in right foot: Secondary | ICD-10-CM | POA: Diagnosis not present

## 2022-09-08 DIAGNOSIS — D1801 Hemangioma of skin and subcutaneous tissue: Secondary | ICD-10-CM | POA: Diagnosis not present

## 2022-09-15 DIAGNOSIS — R011 Cardiac murmur, unspecified: Secondary | ICD-10-CM | POA: Diagnosis not present

## 2022-09-15 DIAGNOSIS — I10 Essential (primary) hypertension: Secondary | ICD-10-CM | POA: Diagnosis not present

## 2022-09-15 DIAGNOSIS — E782 Mixed hyperlipidemia: Secondary | ICD-10-CM | POA: Diagnosis not present

## 2022-09-15 DIAGNOSIS — R079 Chest pain, unspecified: Secondary | ICD-10-CM | POA: Diagnosis not present

## 2022-09-15 DIAGNOSIS — Z23 Encounter for immunization: Secondary | ICD-10-CM | POA: Diagnosis not present

## 2022-09-20 DIAGNOSIS — M65871 Other synovitis and tenosynovitis, right ankle and foot: Secondary | ICD-10-CM | POA: Diagnosis not present

## 2022-09-20 DIAGNOSIS — M19071 Primary osteoarthritis, right ankle and foot: Secondary | ICD-10-CM | POA: Diagnosis not present

## 2022-09-20 DIAGNOSIS — M722 Plantar fascial fibromatosis: Secondary | ICD-10-CM | POA: Diagnosis not present

## 2022-09-20 DIAGNOSIS — M79671 Pain in right foot: Secondary | ICD-10-CM | POA: Diagnosis not present

## 2022-09-26 DIAGNOSIS — M79671 Pain in right foot: Secondary | ICD-10-CM | POA: Diagnosis not present

## 2022-09-27 DIAGNOSIS — I10 Essential (primary) hypertension: Secondary | ICD-10-CM | POA: Diagnosis not present

## 2022-09-27 DIAGNOSIS — M810 Age-related osteoporosis without current pathological fracture: Secondary | ICD-10-CM | POA: Diagnosis not present

## 2022-09-27 DIAGNOSIS — Z794 Long term (current) use of insulin: Secondary | ICD-10-CM | POA: Diagnosis not present

## 2022-09-27 DIAGNOSIS — E119 Type 2 diabetes mellitus without complications: Secondary | ICD-10-CM | POA: Diagnosis not present

## 2022-10-06 DIAGNOSIS — M84374A Stress fracture, right foot, initial encounter for fracture: Secondary | ICD-10-CM | POA: Diagnosis not present

## 2022-10-06 DIAGNOSIS — S92214A Nondisplaced fracture of cuboid bone of right foot, initial encounter for closed fracture: Secondary | ICD-10-CM | POA: Diagnosis not present

## 2022-10-06 DIAGNOSIS — M19079 Primary osteoarthritis, unspecified ankle and foot: Secondary | ICD-10-CM | POA: Insufficient documentation

## 2022-10-06 DIAGNOSIS — M19071 Primary osteoarthritis, right ankle and foot: Secondary | ICD-10-CM | POA: Diagnosis not present

## 2022-10-06 DIAGNOSIS — M810 Age-related osteoporosis without current pathological fracture: Secondary | ICD-10-CM | POA: Diagnosis not present

## 2022-10-06 DIAGNOSIS — R600 Localized edema: Secondary | ICD-10-CM | POA: Diagnosis not present

## 2022-11-04 DIAGNOSIS — U071 COVID-19: Secondary | ICD-10-CM | POA: Diagnosis not present

## 2022-11-04 DIAGNOSIS — R051 Acute cough: Secondary | ICD-10-CM | POA: Diagnosis not present

## 2022-11-04 DIAGNOSIS — Z03818 Encounter for observation for suspected exposure to other biological agents ruled out: Secondary | ICD-10-CM | POA: Diagnosis not present

## 2023-04-28 NOTE — Progress Notes (Unsigned)
Sleep Medicine   Office Visit  Patient Name: Brittany Cervantes DOB: Jun 28, 1951 MRN [id number]    Chief Complaint: establish care for OSA  Brief History:  Brittany Cervantes presents for an initial consult for sleep evaluation and to establish care. The patient has a longstanding history of sleep apnea and is currently on a CPAP. Sleep quality is poor. This is noted most nights. Prior to using a PAP, the patient reported the following symptoms:  snoring, headaches, fatigue, brain fogginess and trouble concentrating . The patient goes to sleep at 0930 pm and wakes up at 0630 am. The patient some anxiety as history of psychiatric problems. The Epworth Sleepiness Score is 0 out of 24 . The patient relates  Cardiovascular risk factors include: hypertension. The patient is currently on a CPAP@ 7 cmH2O. The patient reports using her PAP and feels rested after sleeping with PAP.  The patient reports benefiting from PAP use and would like for her to continue using PAP. Reported sleepiness is  improved. The  one year compliance download shows  100% compliance with an average use time of 9 hours 1  minute. The AHI is 1.6.  The patient continues to require PAP therapy as a medical necessity in order to eliminate his/her sleep apnea.    ROS  General: (-) fever, (-) chills, (-) night sweat Nose and Sinuses: (-) nasal stuffiness or itchiness, (-) postnasal drip, (-) nosebleeds, (-) sinus trouble. Mouth and Throat: (-) sore throat, (-) hoarseness. Neck: (-) swollen glands, (-) enlarged thyroid, (-) neck pain. Respiratory: - cough, - shortness of breath, - wheezing. Neurologic: - numbness, - tingling. Psychiatric: - anxiety, - depression Sleep behavior: -sleep paralysis -hypnogogic hallucinations -dream enactment      -vivid dreams -cataplexy -night terrors -sleep walking   Current Medication: Outpatient Encounter Medications as of 05/01/2023  Medication Sig   azelastine (ASTELIN) 0.1 % nasal spray Place into the nose.    nortriptyline (PAMELOR) 10 MG capsule    amLODipine (NORVASC) 5 MG tablet Take 5 mg by mouth daily.   ascorbic acid (VITAMIN C) 1000 MG tablet Take by mouth.   aspirin EC 81 MG tablet Take 81 mg by mouth daily.   aspirin EC 81 MG tablet Take 1 tablet by mouth daily.   citalopram (CELEXA) 10 MG tablet Take by mouth.   ferrous gluconate (FERGON) 324 MG tablet Take by mouth.   furosemide (LASIX) 20 MG tablet Take 20 mg by mouth.   gabapentin (NEURONTIN) 300 MG capsule Take 300 mg by mouth 3 (three) times daily.   glimepiride (AMARYL) 4 MG tablet Take 4 mg by mouth daily with breakfast.   hydrochlorothiazide (HYDRODIURIL) 25 MG tablet Take 25 mg by mouth daily.   Insulin Glargine (BASAGLAR KWIKPEN) 100 UNIT/ML Inject into the skin.   metFORMIN (GLUCOPHAGE) 1000 MG tablet Take 1 tablet by mouth 2 (two) times daily with a meal.   rosuvastatin (CRESTOR) 20 MG tablet Take by mouth.   spironolactone (ALDACTONE) 25 MG tablet Take by mouth.   TRESIBA FLEXTOUCH 100 UNIT/ML FlexTouch Pen SMARTSIG:50 Unit(s) SUB-Q Daily   [DISCONTINUED] azelastine (ASTELIN) 0.1 % nasal spray Place 1 spray into both nostrils 2 (two) times daily. Use in each nostril as directed   [DISCONTINUED] metoprolol tartrate (LOPRESSOR) 25 MG tablet Take by mouth.   No facility-administered encounter medications on file as of 05/01/2023.    Surgical History: History reviewed. No pertinent surgical history.  Medical History: Past Medical History:  Diagnosis Date   Diabetes mellitus without  complication (HCC)    Heart murmur    Hypercholesteremia    Hypertension    Migraines    Sleep apnea     Family History: Non contributory to the present illness  Social History: Social History   Socioeconomic History   Marital status: Married    Spouse name: Not on file   Number of children: Not on file   Years of education: Not on file   Highest education level: Not on file  Occupational History   Not on file  Tobacco Use    Smoking status: Former    Types: Cigarettes   Smokeless tobacco: Never  Vaping Use   Vaping Use: Never used  Substance and Sexual Activity   Alcohol use: Never   Drug use: Never   Sexual activity: Not on file  Other Topics Concern   Not on file  Social History Narrative   Not on file   Social Determinants of Health   Financial Resource Strain: Not on file  Food Insecurity: Not on file  Transportation Needs: Not on file  Physical Activity: Not on file  Stress: Not on file  Social Connections: Not on file  Intimate Partner Violence: Not on file    Vital Signs: Blood pressure (!) 143/78, pulse 78, resp. rate 18, height 5\' 8"  (1.727 m), weight 234 lb (106.1 kg), SpO2 97 %. Body mass index is 35.58 kg/m.   Examination: General Appearance: The patient is well-developed, well-nourished, and in no distress. Neck Circumference: 40 cm Skin: Gross inspection of skin unremarkable. Head: normocephalic, no gross deformities. Eyes: no gross deformities noted. ENT: ears appear grossly normal Neurologic: Alert and oriented. No involuntary movements.    STOP BANG RISK ASSESSMENT S (snore) Have you been told that you snore?     NO   T (tired) Are you often tired, fatigued, or sleepy during the day?   NO  O (obstruction) Do you stop breathing, choke, or gasp during sleep? NO   P (pressure) Do you have or are you being treated for high blood pressure? YES   B (BMI) Is your body index greater than 35 kg/m? NO   A (age) Are you 71 years old or older? YES   N (neck) Do you have a neck circumference greater than 16 inches?   NO   G (gender) Are you a female? NO   TOTAL STOP/BANG "YES" ANSWERS 2                                                               A STOP-Bang score of 2 or less is considered low risk, and a score of 5 or more is high risk for having either moderate or severe OSA. For people who score 3 or 4, doctors may need to perform further assessment to determine  how likely they are to have OSA.         EPWORTH SLEEPINESS SCALE:  Scale:  (0)= no chance of dozing; (1)= slight chance of dozing; (2)= moderate chance of dozing; (3)= high chance of dozing  Chance  Situtation    Sitting and reading: 0    Watching TV: 0    Sitting Inactive in public: 0    As a passenger in car: 0  Lying down to rest: 0    Sitting and talking: 0    Sitting quielty after lunch: 0    In a car, stopped in traffic: 0   TOTAL SCORE:   0 out of 24    SLEEP STUDIES:  Not available   CPAP COMPLIANCE DATA:  Date Range: 05/02/2022-05/01/2023  Average Daily Use: 9 hours 1 minutes  Median Use: 9 hours 1 minutes  Compliance for > 4 Hours: 100 %  AHI: 1.6 respiratory events per hour  Days Used: 364/365 days  Mask Leak: 11.8  95th Percentile Pressure: 7   LABS: No results found for this or any previous visit (from the past 2160 hour(s)).  Radiology: No results found.  No results found.  No results found.    Assessment and Plan: Patient Active Problem List   Diagnosis Date Noted   OSA (obstructive sleep apnea) 05/01/2023   CPAP use counseling 05/01/2023   Osteoporosis 10/06/2022   Diabetes mellitus type 2, insulin dependent (HCC) 05/11/2021   Fatty liver 05/11/2021   Major depressive disorder, recurrent, in full remission (HCC) 05/11/2021   Bradycardia 03/03/2021   Gastroesophageal reflux disease 11/18/2020   Osteoarthritis 11/13/2018   Arthralgia 08/23/2018   Type 2 diabetes mellitus with hyperglycemia (HCC) 12/05/2016   Essential hypertension 08/01/2016   1. OSA (obstructive sleep apnea) Doing well with cpap at 7cm. Continue. She tolerates and reports benefit with the treatment. F/u one year.   2. CPAP use counseling CPAP Counseling: had a lengthy discussion with the patient regarding the importance of PAP therapy in management of the sleep apnea. Patient appears to understand the risk factor reduction and also understands  the risks associated with untreated sleep apnea. Patient will try to make a good faith effort to remain compliant with therapy. Also instructed the patient on proper cleaning of the device including the water must be changed daily if possible and use of distilled water is preferred. Patient understands that the machine should be regularly cleaned with appropriate recommended cleaning solutions that do not damage the PAP machine for example given white vinegar and water rinses. Other methods such as ozone treatment may not be as good as these simple methods to achieve cleaning.   3. Restless leg syndrome Discussed that nortryptyline may aggravate this. Avoid caffeine. Try magnesium.      General Counseling: I have discussed the findings of the evaluation and examination with Brittany Cervantes.  I have also discussed any further diagnostic evaluation thatmay be needed or ordered today. Brittany Cervantes verbalizes understanding of the findings of todays visit. We also reviewed her medications today and discussed drug interactions and side effects including but not limited excessive drowsiness and altered mental states. We also discussed that there is always a risk not just to her but also people around her. she has been encouraged to call the office with any questions or concerns that should arise related to todays visit.  No orders of the defined types were placed in this encounter.       I have personally obtained a history, evaluated the patient, evaluated pertinent data, formulated the assessment and plan and placed orders.   This patient was seen today by Emmaline Kluver, PA-C in collaboration with Dr. Freda Munro.   Yevonne Pax, MD [facility] Diplomate ABMS Pulmonary and Critical Care Medicine Sleep medicine

## 2023-05-01 ENCOUNTER — Ambulatory Visit (INDEPENDENT_AMBULATORY_CARE_PROVIDER_SITE_OTHER): Payer: Medicare HMO | Admitting: Internal Medicine

## 2023-05-01 VITALS — BP 143/78 | HR 78 | Resp 18 | Ht 68.0 in | Wt 234.0 lb

## 2023-05-01 DIAGNOSIS — G2581 Restless legs syndrome: Secondary | ICD-10-CM | POA: Diagnosis not present

## 2023-05-01 DIAGNOSIS — G4733 Obstructive sleep apnea (adult) (pediatric): Secondary | ICD-10-CM | POA: Diagnosis not present

## 2023-05-01 DIAGNOSIS — Z7189 Other specified counseling: Secondary | ICD-10-CM | POA: Diagnosis not present

## 2023-05-01 NOTE — Patient Instructions (Signed)

## 2023-05-10 ENCOUNTER — Ambulatory Visit
Admission: EM | Admit: 2023-05-10 | Discharge: 2023-05-10 | Disposition: A | Discharge status: Home / Self Care | Payer: Medicare HMO | Attending: Nurse Practitioner | Admitting: Nurse Practitioner

## 2023-05-10 DIAGNOSIS — R21 Rash and other nonspecific skin eruption: Secondary | ICD-10-CM

## 2023-05-10 MED ORDER — TRIAMCINOLONE ACETONIDE 0.1 % EX CREA: 1.0000 | TOPICAL_CREAM | Freq: Two times a day (BID) | CUTANEOUS | 0 refills | Status: DC

## 2023-05-10 NOTE — ED Provider Notes (Signed)
[facility]    CSN: [id number] Arrival date & time: 05/10/23  1645      History   Chief Complaint Chief Complaint  Patient presents with   Rash    HPI Brittany Cervantes is a 72 y.o. female presents for evaluation of a rash.  Patient reports today she noticed today a pruritic nonpainful rash on her right upper thigh.  Denies any new contacts including soaps, detergents, medications, lotions, etc.  No history of eczema or psoriasis.  She tried topical hydrocortisone cream and some Benadryl with minimal improvement.  No other concerns at this time.   Rash   Past Medical History:  Diagnosis Date   Diabetes mellitus without complication (HCC)    Heart murmur    Hypercholesteremia    Hypertension    Migraines    Sleep apnea     Patient Active Problem List   Diagnosis Date Noted   OSA (obstructive sleep apnea) 05/01/2023   CPAP use counseling 05/01/2023   Restless leg syndrome 05/01/2023   Osteoporosis 10/06/2022   Diabetes mellitus type 2, insulin dependent (HCC) 05/11/2021   Fatty liver 05/11/2021   Major depressive disorder, recurrent, in full remission (HCC) 05/11/2021   Bradycardia 03/03/2021   Gastroesophageal reflux disease 11/18/2020   Osteoarthritis 11/13/2018   Arthralgia 08/23/2018   Type 2 diabetes mellitus with hyperglycemia (HCC) 12/05/2016   Essential hypertension 08/01/2016    History reviewed. No pertinent surgical history.  OB History   No obstetric history on file.      Home Medications    Prior to Admission medications   Medication Sig Start Date End Date Taking? Authorizing Provider  amLODipine (NORVASC) 5 MG tablet Take 5 mg by mouth daily.   Yes [provider]  ascorbic acid (VITAMIN C) 1000 MG tablet Take by mouth.   Yes [provider]  aspirin EC 81 MG tablet Take 81 mg by mouth daily.   Yes [provider]  aspirin EC 81 MG tablet Take 1 tablet by mouth daily.   Yes [provider]   azelastine (ASTELIN) 0.1 % nasal spray Place into the nose. 11/18/20  Yes [provider]  citalopram (CELEXA) 10 MG tablet Take by mouth. 08/29/20 05/10/23 Yes [provider]  ferrous gluconate (FERGON) 324 MG tablet Take by mouth.   Yes [provider]  furosemide (LASIX) 20 MG tablet Take 20 mg by mouth.   Yes [provider]  gabapentin (NEURONTIN) 300 MG capsule Take 300 mg by mouth 3 (three) times daily.   Yes [provider]  glimepiride (AMARYL) 4 MG tablet Take 4 mg by mouth daily with breakfast.   Yes [provider]  hydrochlorothiazide (HYDRODIURIL) 25 MG tablet Take 25 mg by mouth daily.   Yes [provider]  Insulin Glargine (BASAGLAR KWIKPEN) 100 UNIT/ML Inject into the skin. 05/26/20  Yes [provider]  metFORMIN (GLUCOPHAGE) 1000 MG tablet Take 1 tablet by mouth 2 (two) times daily with a meal. 05/03/18  Yes [provider]  nortriptyline (PAMELOR) 10 MG capsule  09/27/22  Yes [provider]  rosuvastatin (CRESTOR) 20 MG tablet Take by mouth. 05/04/20  Yes [provider]  spironolactone (ALDACTONE) 25 MG tablet Take by mouth. 06/16/20  Yes [provider]  TRESIBA FLEXTOUCH 100 UNIT/ML FlexTouch Pen SMARTSIG:50 Unit(s) SUB-Q Daily   Yes [provider]  triamcinolone cream (KENALOG) 0.1 % Apply 1 Application topically 2 (two) times daily. 05/10/23  Yes Radford Pax,  NP    Family History History reviewed. No pertinent family history.  Social History Social History   Tobacco Use   Smoking status: Former    Types: Cigarettes   Smokeless tobacco: Never  Vaping Use   Vaping Use: Never used  Substance Use Topics   Alcohol use: Never   Drug use: Never     Allergies   Covid-19 (mrna) vaccine, Duloxetine hcl, Iodinated contrast media, Lidocaine, Losartan, Other, Duloxetine, Fentanyl, Propofol, and Sevoflurane   Review of Systems Review of Systems  Skin:   Positive for rash.     Physical Exam Triage Vital Signs ED Triage Vitals  Enc Vitals Group     BP 05/10/23 1654 138/84     Pulse Rate 05/10/23 1654 63     Resp 05/10/23 1654 16     Temp 05/10/23 1654 98.3 F (36.8 C)     Temp Source 05/10/23 1654 Oral     SpO2 05/10/23 1654 95 %     Weight 05/10/23 1653 225 lb (102.1 kg)     Height 05/10/23 1653 5\' 8"  (1.727 m)     Head Circumference --      Peak Flow --      Pain Score 05/10/23 1657 0     Pain Loc --      Pain Edu? --      Excl. in GC? --    No data found.  Updated Vital Signs BP 138/84 (BP Location: Left Arm)   Pulse 63   Temp 98.3 F (36.8 C) (Oral)   Resp 16   Ht 5\' 8"  (1.727 m)   Wt 225 lb (102.1 kg)   SpO2 95%   BMI 34.21 kg/m   Visual Acuity Right Eye Distance:   Left Eye Distance:   Bilateral Distance:    Right Eye Near:   Left Eye Near:    Bilateral Near:     Physical Exam Vitals and nursing note reviewed.  Constitutional:      General: She is not in acute distress.    Appearance: Normal appearance. She is not ill-appearing.  HENT:     Head: Normocephalic and atraumatic.  Eyes:     Pupils: Pupils are equal, round, and reactive to light.  Cardiovascular:     Rate and Rhythm: Normal rate.  Pulmonary:     Effort: Pulmonary effort is normal.  Skin:    General: Skin is warm and dry.     Findings: Rash present. Rash is macular and papular. Rash is not crusting, nodular, purpuric, pustular, scaling, urticarial or vesicular.       Neurological:     General: No focal deficit present.     Mental Status: She is alert and oriented to person, place, and time.  Psychiatric:        Mood and Affect: Mood normal.        Behavior: Behavior normal.      UC Treatments / Results  Labs (all labs ordered are listed, but only abnormal results are displayed) Labs Reviewed - No data to display  EKG   Radiology No results found.  Procedures Procedures (including critical care time)  Medications  Ordered in UC Medications - No data to display  Initial Impression / Assessment and Plan / UC Course  I have reviewed the triage vital signs and the nursing notes.  Pertinent labs & imaging results that were available during my care of the patient were reviewed by me and considered in my medical decision  making (see chart for details).     Reviewed exam and symptoms with patient.  No red flags.  Presentation not typical for shingles.  Will start Kenalog topically and may use over-the-counter Benadryl as needed.  Patient to follow-up with PCP if symptoms do not improve.  ER precautions reviewed and patient verbalized understanding Final Clinical Impressions(s) / UC Diagnoses   Final diagnoses:  Rash and nonspecific skin eruption     Discharge Instructions      Kenalog double steroid cream twice daily to the rash area.  You may take over-the-counter Benadryl as needed.  Please follow-up with your PCP if your symptoms do not improve.  Please go to the ER for any worsening symptoms.    ED Prescriptions     Medication Sig Dispense Auth. Provider   triamcinolone cream (KENALOG) 0.1 % Apply 1 Application topically 2 (two) times daily. 30 g Radford Pax, NP      PDMP not reviewed this encounter.   Radford Pax, NP 05/10/23 1714

## 2023-05-10 NOTE — ED Triage Notes (Signed)
Pt c/o rash in R leg x2 days. Has tried benadryl & hydrocortisone w/o relief. States leg itchy & burning.

## 2023-05-10 NOTE — Discharge Instructions (Signed)
Kenalog double steroid cream twice daily to the rash area.  You may take over-the-counter Benadryl as needed.  Please follow-up with your PCP if your symptoms do not improve.  Please go to the ER for any worsening symptoms.

## 2023-11-10 DIAGNOSIS — Z794 Long term (current) use of insulin: Secondary | ICD-10-CM | POA: Diagnosis not present

## 2023-11-10 DIAGNOSIS — E1165 Type 2 diabetes mellitus with hyperglycemia: Secondary | ICD-10-CM | POA: Diagnosis not present

## 2023-11-10 LAB — HEMOGLOBIN A1C: Hemoglobin A1C: 6.8

## 2023-11-14 DIAGNOSIS — Z794 Long term (current) use of insulin: Secondary | ICD-10-CM | POA: Diagnosis not present

## 2023-11-14 DIAGNOSIS — I10 Essential (primary) hypertension: Secondary | ICD-10-CM | POA: Diagnosis not present

## 2023-11-14 DIAGNOSIS — E119 Type 2 diabetes mellitus without complications: Secondary | ICD-10-CM | POA: Diagnosis not present

## 2023-11-14 DIAGNOSIS — E1140 Type 2 diabetes mellitus with diabetic neuropathy, unspecified: Secondary | ICD-10-CM | POA: Diagnosis not present

## 2023-11-14 DIAGNOSIS — G629 Polyneuropathy, unspecified: Secondary | ICD-10-CM | POA: Diagnosis not present

## 2023-11-14 DIAGNOSIS — E785 Hyperlipidemia, unspecified: Secondary | ICD-10-CM | POA: Diagnosis not present

## 2023-11-14 DIAGNOSIS — F339 Major depressive disorder, recurrent, unspecified: Secondary | ICD-10-CM | POA: Diagnosis not present

## 2023-11-14 DIAGNOSIS — G4733 Obstructive sleep apnea (adult) (pediatric): Secondary | ICD-10-CM | POA: Diagnosis not present

## 2023-11-14 DIAGNOSIS — F3342 Major depressive disorder, recurrent, in full remission: Secondary | ICD-10-CM | POA: Diagnosis not present

## 2023-11-21 DIAGNOSIS — M19032 Primary osteoarthritis, left wrist: Secondary | ICD-10-CM | POA: Diagnosis not present

## 2023-11-21 DIAGNOSIS — M19022 Primary osteoarthritis, left elbow: Secondary | ICD-10-CM | POA: Diagnosis not present

## 2023-11-21 DIAGNOSIS — M79644 Pain in right finger(s): Secondary | ICD-10-CM | POA: Diagnosis not present

## 2023-11-21 DIAGNOSIS — M79602 Pain in left arm: Secondary | ICD-10-CM | POA: Diagnosis not present

## 2023-11-21 DIAGNOSIS — M151 Heberden's nodes (with arthropathy): Secondary | ICD-10-CM | POA: Diagnosis not present

## 2023-11-21 DIAGNOSIS — G5603 Carpal tunnel syndrome, bilateral upper limbs: Secondary | ICD-10-CM | POA: Diagnosis not present

## 2023-11-21 DIAGNOSIS — M152 Bouchard's nodes (with arthropathy): Secondary | ICD-10-CM | POA: Diagnosis not present

## 2023-11-28 DIAGNOSIS — Z884 Allergy status to anesthetic agent status: Secondary | ICD-10-CM | POA: Diagnosis not present

## 2023-11-28 DIAGNOSIS — L508 Other urticaria: Secondary | ICD-10-CM | POA: Diagnosis not present

## 2023-12-13 DIAGNOSIS — G5602 Carpal tunnel syndrome, left upper limb: Secondary | ICD-10-CM | POA: Diagnosis not present

## 2023-12-19 ENCOUNTER — Ambulatory Visit (INDEPENDENT_AMBULATORY_CARE_PROVIDER_SITE_OTHER): Payer: PPO | Admitting: Family Medicine

## 2023-12-19 ENCOUNTER — Encounter: Payer: Self-pay | Admitting: Family Medicine

## 2023-12-19 VITALS — BP 122/70 | HR 82 | Ht 68.0 in | Wt 229.0 lb

## 2023-12-19 DIAGNOSIS — E119 Type 2 diabetes mellitus without complications: Secondary | ICD-10-CM

## 2023-12-19 DIAGNOSIS — Z794 Long term (current) use of insulin: Secondary | ICD-10-CM | POA: Diagnosis not present

## 2023-12-19 DIAGNOSIS — M810 Age-related osteoporosis without current pathological fracture: Secondary | ICD-10-CM

## 2023-12-19 DIAGNOSIS — L209 Atopic dermatitis, unspecified: Secondary | ICD-10-CM | POA: Diagnosis not present

## 2023-12-19 DIAGNOSIS — F3342 Major depressive disorder, recurrent, in full remission: Secondary | ICD-10-CM | POA: Diagnosis not present

## 2023-12-19 DIAGNOSIS — I10 Essential (primary) hypertension: Secondary | ICD-10-CM

## 2023-12-19 MED ORDER — HYDROCHLOROTHIAZIDE 12.5 MG PO TABS
12.5000 mg | ORAL_TABLET | Freq: Every day | ORAL | 1 refills | Status: DC
Start: 2023-12-19 — End: 2024-02-23

## 2023-12-19 MED ORDER — TRIAMCINOLONE ACETONIDE 0.5 % EX CREA
1.0000 | TOPICAL_CREAM | Freq: Two times a day (BID) | CUTANEOUS | 2 refills | Status: DC
Start: 2023-12-19 — End: 2024-08-05

## 2023-12-19 MED ORDER — MOUNJARO 7.5 MG/0.5ML ~~LOC~~ SOAJ
7.5000 mg | SUBCUTANEOUS | 2 refills | Status: DC
Start: 2023-12-19 — End: 2024-02-23

## 2023-12-19 NOTE — Progress Notes (Unsigned)
 Subjective:    Patient ID: Brittany Cervantes, female    DOB: 07-23-1951, 73 y.o.   MRN: [id number]  Kazzandra Desaulniers is a 73 y.o. female presenting on 12/19/2023 for No chief complaint on file.   HPI  Discussed the use of AI scribe software for clinical note transcription with the patient, who gave verbal consent to proceed.  History of Present Illness          Previously [facility]   ***prefer to change PCP, disagreement  ***Leg Rash Failed antibiotic not cellulitis Failed triamcinolone  ***[facility] Last visit 10/30/23 - A1c 7.5%, improved Mounjaro early 2025 - - For now, she will continue metformin, glimepiride and Tresiba as prescribed.   ***Mounjaro 5mg  weekly - Tresiba evening 25 to 50 units based on CBG - Metformin 1000mg  TWICE A DAY - Glimepiride 4mg  daily *** reduced to AM only not TWICE A DAY  ***DC Glimepiride  ***PA Authorization for Mounjaro 7.5  ***Losartan ***    Health Maintenance: ***     12/19/2023   10:21 AM  Depression screen PHQ 2/9  Decreased Interest 0  Down, Depressed, Hopeless 0  PHQ - 2 Score 0       12/19/2023   10:21 AM  GAD 7 : Generalized Anxiety Score  Nervous, Anxious, on Edge 1  Anxiety Difficulty Somewhat difficult     Past Medical History:  Diagnosis Date   Diabetes mellitus without complication (HCC)    Fatty liver    Fibromyalgia    Heart murmur    Hypercholesteremia    Hypertension    Migraines    Neuropathy    Osteoporosis    Sleep apnea    Past Surgical History:  Procedure Laterality Date   CATARACT EXTRACTION Bilateral    CESAREAN SECTION     CHOLECYSTECTOMY     KNEE ARTHROSCOPY Left    LYMPH NODE BIOPSY     tonsillectony     TOTAL HIP ARTHROPLASTY Left    2007   TOTAL HIP ARTHROPLASTY Right    2014   Social History   Socioeconomic History   Marital status: Married    Spouse name: Not on file   Number of children: Not on file   Years of education: Not on file   Highest  education level: Not on file  Occupational History   Not on file  Tobacco Use   Smoking status: Former    Types: Cigarettes   Smokeless tobacco: Never  Vaping Use   Vaping status: Never Used  Substance and Sexual Activity   Alcohol use: Yes   Drug use: Never   Sexual activity: Not on file  Other Topics Concern   Not on file  Social History Narrative   Not on file   Social Drivers of Health   Financial Resource Strain: Not on file  Food Insecurity: No Food Insecurity (11/29/2019)   [facility]   Hunger Vital Sign    Worried About Running Out of Food in the Last Year: Never true    Ran Out of Food in the Last Year: Never true  Transportation Needs: Not on file  Physical Activity: Not on file  Stress: Not on file  Social Connections: Not on file  Intimate Partner Violence: Not on file   No family history on file. Current Outpatient Medications on File Prior to Visit  Medication Sig   ascorbic acid (VITAMIN C) 1000 MG tablet Take by mouth.  aspirin EC 81 MG tablet Take 1 tablet by mouth daily.   azelastine (ASTELIN) 0.1 % nasal spray Place into the nose.   cetirizine (ZYRTEC) 10 MG tablet Take 10 mg by mouth daily.   ferrous gluconate (FERGON) 324 MG tablet Take by mouth.   furosemide (LASIX) 20 MG tablet Take 20 mg by mouth.   metFORMIN (GLUCOPHAGE) 1000 MG tablet Take 1 tablet by mouth 2 (two) times daily with a meal.   nortriptyline (PAMELOR) 10 MG capsule    rosuvastatin (CRESTOR) 20 MG tablet Take by mouth.   TRESIBA FLEXTOUCH 100 UNIT/ML FlexTouch Pen Inject 20 Units into the skin at bedtime. Adjusted dose   amLODipine (NORVASC) 5 MG tablet Take 1 tablet (5 mg total) by mouth daily.   aspirin EC 81 MG tablet Take 81 mg by mouth daily.   citalopram (CELEXA) 10 MG tablet Take by mouth.   gabapentin (NEURONTIN) 300 MG capsule Take 300 mg by mouth 3 (three) times daily.   No current facility-administered medications on file prior to  visit.    Review of Systems Per HPI unless specifically indicated above     Objective:    BP 122/70   Pulse 82   Ht 5\' 8"  (1.727 m)   Wt 229 lb (103.9 kg)   SpO2 97%   BMI 34.82 kg/m   Wt Readings from Last 3 Encounters:  12/19/23 229 lb (103.9 kg)  05/10/23 225 lb (102.1 kg)  05/01/23 234 lb (106.1 kg)    Physical Exam  Results for orders placed or performed during the hospital encounter of 11/12/20  SARS CORONAVIRUS 2 (TAT 6-24 HRS) Nasopharyngeal Nasopharyngeal Swab   Collection Time: 11/12/20 12:29 PM   Specimen: Nasopharyngeal Swab  Result Value Ref Range   SARS Coronavirus 2 NEGATIVE NEGATIVE      Assessment & Plan:   Problem List Items Addressed This Visit     Diabetes mellitus type 2, insulin dependent (HCC) - Primary   Relevant Medications   MOUNJARO 7.5 MG/0.5ML Pen   Essential hypertension   Relevant Medications   amLODipine (NORVASC) 5 MG tablet   hydrochlorothiazide (HYDRODIURIL) 12.5 MG tablet   Major depressive disorder, recurrent, in full remission (HCC)   Osteoporosis   Other Visit Diagnoses       Atopic dermatitis, unspecified type       Relevant Medications   triamcinolone cream (KENALOG) 0.5 %        Updated Health Maintenance information ***- Reviewed recent lab results with patient Encouraged improvement to lifestyle with diet and exercise -*** Goal of weight loss  Assessment and Plan              No orders of the defined types were placed in this encounter.   Meds ordered this encounter  Medications   MOUNJARO 7.5 MG/0.5ML Pen    Sig: Inject 7.5 mg into the skin once a week.    Dispense:  2 mL    Refill:  2   hydrochlorothiazide (HYDRODIURIL) 12.5 MG tablet    Sig: Take 1 tablet (12.5 mg total) by mouth daily.    Dispense:  90 tablet    Refill:  1   triamcinolone cream (KENALOG) 0.5 %    Sig: Apply 1 Application topically 2 (two) times daily. To affected areas, for up to 2 weeks.    Dispense:  30 g    Refill:   2     Follow up plan: Return in about 3 months (around 03/17/2024) for  3 month DM A1c.  Saralyn Pilar, DO [facility] 12/19/2023, 10:28 AM

## 2023-12-19 NOTE — Patient Instructions (Addendum)
Thank you for coming to the office today.  Stop Glimepiride Check sugars fasting and bedtime, write down log  Reduce Amlodipine to 5mg  daily, not 10  Add new hydrochlorothiazide 12.5mg  daily for BP  and swelling  Tresiba 20 units at bedtime for now, may HOLD dose if evening sugar is < 150  Keep metformin  Mounjaro dose inc to 7.5mg  weekly we will do the authorization  Please schedule a Follow-up Appointment to: Return in about 3 months (around 03/17/2024) for 3 month DM A1c.  If you have any other questions or concerns, please feel free to call the office or send a message through MyChart. You may also schedule an earlier appointment if necessary.  Additionally, you may be receiving a survey about your experience at our office within a few days to 1 week by e-mail or mail. We value your feedback.  Saralyn Pilar, DO [facility], [place]

## 2023-12-20 ENCOUNTER — Encounter: Payer: Self-pay | Admitting: Family Medicine

## 2023-12-21 ENCOUNTER — Encounter: Payer: Self-pay | Admitting: Family Medicine

## 2023-12-21 DIAGNOSIS — M797 Fibromyalgia: Secondary | ICD-10-CM | POA: Insufficient documentation

## 2024-01-16 ENCOUNTER — Encounter: Payer: Self-pay | Admitting: Family Medicine

## 2024-01-16 DIAGNOSIS — E119 Type 2 diabetes mellitus without complications: Secondary | ICD-10-CM

## 2024-01-17 DIAGNOSIS — G4733 Obstructive sleep apnea (adult) (pediatric): Secondary | ICD-10-CM | POA: Diagnosis not present

## 2024-01-17 DIAGNOSIS — G5601 Carpal tunnel syndrome, right upper limb: Secondary | ICD-10-CM | POA: Diagnosis not present

## 2024-01-17 DIAGNOSIS — G5602 Carpal tunnel syndrome, left upper limb: Secondary | ICD-10-CM | POA: Diagnosis not present

## 2024-01-17 MED ORDER — GLIMEPIRIDE 4 MG PO TABS
4.0000 mg | ORAL_TABLET | Freq: Every day | ORAL | 1 refills | Status: DC
Start: 2024-01-17 — End: 2024-04-15

## 2024-01-17 NOTE — Addendum Note (Signed)
 Addended by: Smitty Cords on: 01/17/2024 06:01 PM   Modules accepted: Orders

## 2024-01-22 ENCOUNTER — Other Ambulatory Visit: Payer: Self-pay | Admitting: Family Medicine

## 2024-01-22 NOTE — Telephone Encounter (Addendum)
 Copied from CRM [id number]. Topic: Clinical - Medication Refill >> Jan 22, 2024  1:56 PM Payton Doughty wrote: Most Recent Primary Care Visit:  Provider: Smitty Cords  Department: [facility]  Visit Type: NEW PATIENT  Date: 12/19/2023  Medication: one touch verio test strips Pt test onc in am and once in PM Has the patient contacted their pharmacy? Yes (Dr Kirtland Bouchard has never filled for the pt.  She is new w/ him Pt test 2 times/day, in the morning and at night. Pt has one strip left.  Hopes to get today  Is this the correct pharmacy for this prescription? Yes If no, delete pharmacy and type the correct one.  This is the patient's preferred pharmacy:  [facility] - [place], [place] - [address] [place] [address] Phone: [phone] Fax: [phone]   Has the prescription been filled recently? No  Is the patient out of the medication? Yes  Has the patient been seen for an appointment in the last year OR does the patient have an upcoming appointment? Yes  Can we respond through MyChart? Yes  Agent: Please be advised that Rx refills may take up to 3 business days. We ask that you follow-up with your pharmacy.

## 2024-01-23 NOTE — Telephone Encounter (Signed)
 Requested medications are due for refill today.  unsure  Requested medications are on the active medications list.  As historical  Last refill. 01/22/2024  Future visit scheduled.   yes  Notes to clinic.  Please review for refill.    Requested Prescriptions  Pending Prescriptions Disp Refills   glucose blood test strip 100 each     Sig: 1 each by Other route as needed for other. Use as instructed     Endocrinology: Diabetes - Testing Supplies Failed - 01/23/2024  5:07 PM      Failed - Valid encounter within last 12 months    Recent Outpatient Visits   None     Future Appointments             In 1 month Karamalegos, Netta Neat, DO [facility], [facility]             glucose blood test strip 100 each     Sig: 1 each by Other route as needed for other. Use as instructed     Endocrinology: Diabetes - Testing Supplies Failed - 01/23/2024  5:07 PM      Failed - Valid encounter within last 12 months    Recent Outpatient Visits   None     Future Appointments             In 1 month Karamalegos, Netta Neat, DO [place] [facility], [facility]            Signed Prescriptions Disp Refills   glucose blood test strip      Sig: 1 each by Other route as needed for other. Use as instructed     There is no refill protocol information for this order     glucose blood test strip      Sig: 1 each by Other route as needed for other. Use as instructed     There is no refill protocol information for this order

## 2024-01-24 ENCOUNTER — Telehealth: Payer: Self-pay

## 2024-01-24 ENCOUNTER — Other Ambulatory Visit: Payer: Self-pay | Admitting: Family Medicine

## 2024-01-24 DIAGNOSIS — E119 Type 2 diabetes mellitus without complications: Secondary | ICD-10-CM

## 2024-01-24 MED ORDER — GLUCOSE BLOOD VI STRP: 1.0000 | ORAL_STRIP | 3 refills | Status: DC | PRN

## 2024-01-24 NOTE — Telephone Encounter (Signed)
 Copied from CRM [id number]. Topic: Clinical - Prescription Issue >> Jan 24, 2024 10:25 AM Izetta Dakin wrote: Reason for CRM: Patient's pharmacy calling and asking for a new prescription for test strips for patient. Patient informed pharmacy that she is testing more than once a day and the prescription needs to have One Touch Vario listed. Callback # [phone]

## 2024-01-24 NOTE — Telephone Encounter (Unsigned)
 Copied from CRM [id number]. Topic: Clinical - Medication Refill >> Jan 24, 2024  3:29 PM Carlatta H wrote: Most Recent Primary Care Visit:  Provider: Smitty Cords  Department: [facility]  Visit Type: NEW PATIENT  Date: 12/19/2023  Medication: glucose blood test strip [[id number]]  Has the patient contacted their pharmacy? Yes (Agent: If no, request that the patient contact the pharmacy for the refill. If patient does not wish to contact the pharmacy document the reason why and proceed with request.) (Agent: If yes, when and what did the pharmacy advise?)  Is this the correct pharmacy for this prescription? Yes If no, delete pharmacy and type the correct one.  This is the patient's preferred pharmacy:  [facility] - [place], [place] - [address] [place] [address] Phone: [phone] Fax: [phone]   Has the prescription been filled recently? No  Is the patient out of the medication? Yes  Has the patient been seen for an appointment in the last year OR does the patient have an upcoming appointment? Yes  Can we respond through MyChart? No  Agent: Please be advised that Rx refills may take up to 3 business days. We ask that you follow-up with your pharmacy.

## 2024-01-25 ENCOUNTER — Other Ambulatory Visit: Payer: Self-pay | Admitting: Family Medicine

## 2024-01-25 ENCOUNTER — Telehealth: Payer: Self-pay

## 2024-01-25 DIAGNOSIS — E119 Type 2 diabetes mellitus without complications: Secondary | ICD-10-CM

## 2024-01-25 MED ORDER — ONETOUCH VERIO VI STRP
ORAL_STRIP | 3 refills | Status: DC
Start: 2024-01-25 — End: 2024-03-08

## 2024-01-25 MED ORDER — TRESIBA FLEXTOUCH 100 UNIT/ML ~~LOC~~ SOPN
50.0000 [IU] | PEN_INJECTOR | Freq: Every day | SUBCUTANEOUS | 1 refills | Status: DC
Start: 2024-01-25 — End: 2024-08-12

## 2024-01-25 NOTE — Telephone Encounter (Signed)
 Updated rx OneTouch Verio new instructions 2 times daily testing and dx code re-sent, printed for patient  Saralyn Pilar, DO [facility] 01/25/2024, 1:20 PM

## 2024-01-25 NOTE — Telephone Encounter (Signed)
 Received refill request for Brittany Cervantes for patient from [facility]. Okay for refill at current dose? Shows historical on our end and I didn't see any prior prescriptions. Thanks!

## 2024-01-25 NOTE — Telephone Encounter (Signed)
 FYI for you. I sent her a detailed mychart message. This situation needs to be handled differently on her end. I told her that because most of her medicines have not been ordered by our office. The current "historical" orders may not be 100% accurate and ready to send to pharmacy. If she is requesting refills through E2C2 or pharmacy or mychart request option that is not good.  I asked her to send [place] a direct mychart message with written instructions on which med, which dose, quantity, days, and which pharmacy. So we can do it correct the first time.  Hopefully that makes sense! I do stress this to most of my new patients so this does not happen.  I will send the [place]. But the original request I received was for [facility]. And this says [facility]. I did ask her to clarify.  Let me know if questions!  Saralyn Pilar, DO [facility] 01/25/2024, 5:24 PM

## 2024-01-26 ENCOUNTER — Encounter: Payer: Self-pay | Admitting: Family Medicine

## 2024-01-26 DIAGNOSIS — F3342 Major depressive disorder, recurrent, in full remission: Secondary | ICD-10-CM

## 2024-01-26 DIAGNOSIS — E119 Type 2 diabetes mellitus without complications: Secondary | ICD-10-CM

## 2024-01-26 DIAGNOSIS — J301 Allergic rhinitis due to pollen: Secondary | ICD-10-CM

## 2024-01-26 DIAGNOSIS — I10 Essential (primary) hypertension: Secondary | ICD-10-CM

## 2024-02-16 DIAGNOSIS — G4733 Obstructive sleep apnea (adult) (pediatric): Secondary | ICD-10-CM | POA: Diagnosis not present

## 2024-02-23 ENCOUNTER — Other Ambulatory Visit: Payer: Self-pay | Admitting: Family Medicine

## 2024-02-23 DIAGNOSIS — E119 Type 2 diabetes mellitus without complications: Secondary | ICD-10-CM

## 2024-02-23 MED ORDER — HYDROCHLOROTHIAZIDE 12.5 MG PO TABS: 12.5000 mg | ORAL_TABLET | Freq: Every day | ORAL | 1 refills | Status: DC

## 2024-02-23 MED ORDER — CETIRIZINE HCL 10 MG PO TABS: 10.0000 mg | ORAL_TABLET | Freq: Every day | ORAL | 1 refills | Status: DC

## 2024-02-23 MED ORDER — CITALOPRAM HYDROBROMIDE 10 MG PO TABS: 10.0000 mg | ORAL_TABLET | Freq: Every day | ORAL | 1 refills | Status: DC

## 2024-02-23 MED ORDER — AMLODIPINE BESYLATE 5 MG PO TABS: 5.0000 mg | ORAL_TABLET | Freq: Every day | ORAL | 1 refills | Status: DC

## 2024-02-23 MED ORDER — METFORMIN HCL 1000 MG PO TABS: 1000.0000 mg | ORAL_TABLET | Freq: Two times a day (BID) | ORAL | 1 refills | Status: DC

## 2024-02-23 NOTE — Telephone Encounter (Signed)
 Requested medication (s) are due for refill today - no  Requested medication (s) are on the active medication list -yes  Future visit scheduled -yes  Last refill: 12/19/23 2ml 2RF  Notes to clinic: off protocol- provider review   Requested Prescriptions  Pending Prescriptions Disp Refills   MOUNJARO 7.5 MG/0.5ML Pen [Pharmacy Med Name: MOUNJARO 7.5 MG/0.5ML SUBQ SOLN ML] 2 mL 2    Sig: INJECT 7.5MG  SUBCUTANEOUSLY ONCE A WEEK     Off-Protocol Failed - 02/23/2024 12:50 PM      Failed - Medication not assigned to a protocol, review manually.      Passed - Valid encounter within last 12 months    Recent Outpatient Visits           2 months ago Diabetes mellitus type 2, insulin dependent [facility])   [place] [facility] [place], Kayleen Party, DO       Future Appointments             In 3 weeks Romeo Co, Kayleen Party, DO [place] [facility], [facility]               Requested Prescriptions  Pending Prescriptions Disp Refills   MOUNJARO 7.5 MG/0.5ML Pen [Pharmacy Med Name: MOUNJARO 7.5 MG/0.5ML SUBQ SOLN ML] 2 mL 2    Sig: INJECT 7.5MG  SUBCUTANEOUSLY ONCE A WEEK     Off-Protocol Failed - 02/23/2024 12:50 PM      Failed - Medication not assigned to a protocol, review manually.      Passed - Valid encounter within last 12 months    Recent Outpatient Visits           2 months ago Diabetes mellitus type 2, insulin dependent [facility])   [place] [facility] [place], Kayleen Party, DO       Future Appointments             In 3 weeks Romeo Co, Kayleen Party, DO [place] [facility], [facility]

## 2024-02-26 ENCOUNTER — Other Ambulatory Visit: Payer: Self-pay | Admitting: Family Medicine

## 2024-02-26 ENCOUNTER — Telehealth: Payer: Self-pay

## 2024-02-26 DIAGNOSIS — Z1231 Encounter for screening mammogram for malignant neoplasm of breast: Secondary | ICD-10-CM

## 2024-02-26 NOTE — Telephone Encounter (Signed)
 Ordered external mammogram order for screening [facility]. We will try to fax it to them .  Domingo Friend, DO [facility] 02/26/2024, 3:51 PM

## 2024-02-26 NOTE — Telephone Encounter (Addendum)
 Copied from CRM [id number]. Topic: General - Other >> Feb 26, 2024 11:49 AM Annice Kim wrote: Reason for CRM: Asking for attending physician  Mammography department  [phone] Patient trying to schedule mammograph please follow up with imaging center    Order faxed to [facility]

## 2024-02-26 NOTE — Telephone Encounter (Signed)
 Order faxed.

## 2024-03-08 ENCOUNTER — Encounter: Payer: Self-pay | Admitting: Family Medicine

## 2024-03-08 DIAGNOSIS — E119 Type 2 diabetes mellitus without complications: Secondary | ICD-10-CM

## 2024-03-08 MED ORDER — ONETOUCH VERIO VI STRP: ORAL_STRIP | 3 refills | Status: DC

## 2024-03-14 DIAGNOSIS — D229 Melanocytic nevi, unspecified: Secondary | ICD-10-CM | POA: Diagnosis not present

## 2024-03-14 DIAGNOSIS — L650 Telogen effluvium: Secondary | ICD-10-CM | POA: Diagnosis not present

## 2024-03-14 DIAGNOSIS — L659 Nonscarring hair loss, unspecified: Secondary | ICD-10-CM | POA: Diagnosis not present

## 2024-03-14 DIAGNOSIS — I872 Venous insufficiency (chronic) (peripheral): Secondary | ICD-10-CM | POA: Diagnosis not present

## 2024-03-14 DIAGNOSIS — Z85828 Personal history of other malignant neoplasm of skin: Secondary | ICD-10-CM | POA: Diagnosis not present

## 2024-03-14 DIAGNOSIS — L821 Other seborrheic keratosis: Secondary | ICD-10-CM | POA: Diagnosis not present

## 2024-03-18 ENCOUNTER — Ambulatory Visit (INDEPENDENT_AMBULATORY_CARE_PROVIDER_SITE_OTHER): Payer: PPO | Admitting: Family Medicine

## 2024-03-18 ENCOUNTER — Other Ambulatory Visit: Payer: Self-pay | Admitting: Family Medicine

## 2024-03-18 ENCOUNTER — Encounter: Payer: Self-pay | Admitting: Family Medicine

## 2024-03-18 VITALS — BP 124/78 | HR 73 | Ht 68.0 in | Wt 212.4 lb

## 2024-03-18 DIAGNOSIS — E119 Type 2 diabetes mellitus without complications: Secondary | ICD-10-CM | POA: Diagnosis not present

## 2024-03-18 DIAGNOSIS — I10 Essential (primary) hypertension: Secondary | ICD-10-CM

## 2024-03-18 DIAGNOSIS — Z0000 Encounter for general adult medical examination without abnormal findings: Secondary | ICD-10-CM

## 2024-03-18 DIAGNOSIS — Z794 Long term (current) use of insulin: Secondary | ICD-10-CM

## 2024-03-18 DIAGNOSIS — R002 Palpitations: Secondary | ICD-10-CM | POA: Diagnosis not present

## 2024-03-18 DIAGNOSIS — I872 Venous insufficiency (chronic) (peripheral): Secondary | ICD-10-CM | POA: Insufficient documentation

## 2024-03-18 DIAGNOSIS — E1169 Type 2 diabetes mellitus with other specified complication: Secondary | ICD-10-CM

## 2024-03-18 DIAGNOSIS — Z7985 Long-term (current) use of injectable non-insulin antidiabetic drugs: Secondary | ICD-10-CM

## 2024-03-18 DIAGNOSIS — R1310 Dysphagia, unspecified: Secondary | ICD-10-CM | POA: Diagnosis not present

## 2024-03-18 LAB — POCT GLYCOSYLATED HEMOGLOBIN (HGB A1C): Hemoglobin A1C: 6.1 % — AB (ref 4.0–5.6)

## 2024-03-18 MED ORDER — MOUNJARO 10 MG/0.5ML ~~LOC~~ SOAJ
10.0000 mg | SUBCUTANEOUS | 2 refills | Status: DC
Start: 2024-03-18 — End: 2024-05-16

## 2024-03-18 NOTE — Progress Notes (Signed)
 Subjective:    Patient ID: Brittany Cervantes, female    DOB: 12-22-1950, 73 y.o.   MRN: [id number]  Brittany Cervantes is a 73 y.o. female presenting on 03/18/2024 for Diabetes and Hypertension   HPI  Discussed the use of AI scribe software for clinical note transcription with the patient, who gave verbal consent to proceed.  History of Present Illness   Brittany Cervantes is a 73 year old female with diabetes and hypertension who presents for a follow-up on blood sugar levels and leg swelling.  Her blood sugar control has improved, with her A1c decreasing from 6.8 to 6.1 over the past four months. Insulin use varies depending on daily activity, sometimes requiring no insulin and other times up to 20 units. Blood sugar tends to be higher in the morning if she has been less active the previous day.  She experiences swelling in her legs, which she associates with her use of amlodipine . The swelling began after starting amlodipine  and worsened when the dose was increased to 10 mg. She was subsequently switched to 5 mg and hydrochlorothiazide  was added. Despite these changes, she continues to experience significant leg swelling and a rash, which her dermatologist attributed to the swelling. She uses clobetasol for the rash, which peels and sometimes comes off in chunks. She is concerned that amlodipine  may still be contributing to the swelling.  She has a history of proteinuria, which was monitored by her nephrologist. She was previously on spironolactone, which was discontinued due to rising potassium levels. No longer following with Nephrology  She reports experiencing palpitations recently, described as an irregular heartbeat that occurred for a few seconds. She plans to discuss this with her cardiologist during her husband's upcoming appointment.  She mentions a sensation when swallowing, described as a peculiar feeling in her throat. She has a history of swallowing difficulties. No significant  difficulty swallowing but notes a peculiar sensation when swallowing.      No longer with [facility]        03/18/2024   10:42 AM 12/19/2023   10:21 AM  Depression screen PHQ 2/9  Decreased Interest 0 0  Down, Depressed, Hopeless 0 0  PHQ - 2 Score 0 0  Altered sleeping 0   Tired, decreased energy 1   Change in appetite 0   Feeling bad or failure about yourself  0   Trouble concentrating 0   Moving slowly or fidgety/restless 0   Suicidal thoughts 0   PHQ-9 Score 1   Difficult doing work/chores Not difficult at all        03/18/2024   10:42 AM 12/19/2023   10:21 AM  GAD 7 : Generalized Anxiety Score  Nervous, Anxious, on Edge 0 1  Control/stop worrying 0   Worry too much - different things 0   Trouble relaxing 0   Restless 0   Easily annoyed or irritable 0   Afraid - awful might happen 0   Total GAD 7 Score 0   Anxiety Difficulty  Somewhat difficult    Social History   Tobacco Use   Smoking status: Former    Types: Cigarettes   Smokeless tobacco: Never  Vaping Use   Vaping status: Never Used  Substance Use Topics   Alcohol  use: Yes   Drug use: Never    Review of Systems Per HPI unless specifically indicated above     Objective:     BP 124/78 (BP Location: Right Arm, Patient Position: Sitting, Cuff Size: Normal)  Pulse 73   Ht 5\' 8"  (1.727 m)   Wt 212 lb 6 oz (96.3 kg)   SpO2 99%   BMI 32.29 kg/m   Wt Readings from Last 3 Encounters:  03/18/24 212 lb 6 oz (96.3 kg)  12/19/23 229 lb (103.9 kg)  05/10/23 225 lb (102.1 kg)    Physical Exam Vitals and nursing note reviewed.  Constitutional:      General: She is not in acute distress.    Appearance: Normal appearance. She is well-developed. She is obese. She is not diaphoretic.     Comments: Well-appearing, comfortable, cooperative  HENT:     Head: Normocephalic and atraumatic.  Eyes:     General:        Right eye: No discharge.        Left eye: No discharge.     Conjunctiva/sclera:  Conjunctivae normal.  Cardiovascular:     Rate and Rhythm: Normal rate.  Pulmonary:     Effort: Pulmonary effort is normal.  Musculoskeletal:     Right lower leg: Edema (mild trace to +1 edema bilateral) present.     Left lower leg: Edema present.  Skin:    General: Skin is warm and dry.     Findings: No erythema or rash.     Comments: Discoloration pigmentation of legs bilateral with some flaky skin  Neurological:     Mental Status: She is alert and oriented to person, place, and time.  Psychiatric:        Mood and Affect: Mood normal.        Behavior: Behavior normal.        Thought Content: Thought content normal.     Comments: Well groomed, good eye contact, normal speech and thoughts     Results for orders placed or performed in visit on 03/18/24  POCT HgB A1C   Collection Time: 03/18/24 10:43 AM  Result Value Ref Range   Hemoglobin A1C 6.1 (A) 4.0 - 5.6 %   HbA1c POC (<> result, manual entry)     HbA1c, POC (prediabetic range)     HbA1c, POC (controlled diabetic range)    Urine Microalbumin w/creat. ratio   Collection Time: 03/18/24 11:26 AM  Result Value Ref Range   Creatinine, Urine 123 20 - 275 mg/dL   Microalb, Ur 59.5 mg/dL   Microalb Creat Ratio 81 (H) <30 mg/g creat      Assessment & Plan:   Problem List Items Addressed This Visit     Diabetes mellitus type 2, insulin dependent (HCC) - Primary   Relevant Medications   MOUNJARO 10 MG/0.5ML Pen   Other Relevant Orders   POCT HgB A1C (Completed)   Urine Microalbumin w/creat. ratio (Completed)   Essential hypertension   Stasis dermatitis of both legs   Other Visit Diagnoses       Dysphagia, unspecified type         Intermittent palpitations         Long-term current use of injectable noninsulin antidiabetic medication           Hypertension Hypertension management complicated by leg swelling likely due to amlodipine  5mg  still, unsuccessful with dose reduction from 10 to 5mg . Hydrochlorothiazide   managing blood pressure. Concerns about proteinuria and potassium levels. Followed by Nephrology in the past.  - Discontinue amlodipine  5 mg daily. Continue other meds, including hydrochlorothiazide  12.5mg  daily - Monitor blood pressure at home daily. - Contact provider within 1-2 weeks with blood pressure readings. - Consider increasing hydrochlorothiazide   dose based on readings. Up to 25mg  if need  - Order urine protein microalbumin test. - Monitor potassium levels when due. 3 months  Proteinuria Followed by Nephrology in past, no longer, needs testing Proteinuria monitored in context of hypertension and medication adjustments. - Order urine protein microalbumin test.  Stasis dermatitis due to leg swelling Established with Dermatologist, confirmed diagnosis Stasis dermatitis related to leg swelling, potentially exacerbated by amlodipine . - Discontinue amlodipine  5 mg daily. - Continue clobetasol as needed for skin peeling and irritation.  Type 2 Diabetes Mellitus with insulin use Type 2 diabetes well-controlled with A1c of 6.1%. Considering increasing Mounjaro dose to improve glycemic control and reduce insulin and glimepiride  use. - Increase Mounjaro dose from 7.5 mg to 10 mg. - Monitor blood glucose levels and adjust glimepiride  use as needed. Caution hypoglycemia - Consider reducing or discontinuing glimepiride  if blood glucose levels remain stable. - Continue monitoring insulin use with goal of reducing dosage.  Dysphagia Intermittent sensation during swallowing without actual difficulty. No immediate intervention required unless symptoms worsen. - Monitor for worsening of swallowing symptoms. - Consider re-evaluation with swallowing studies if symptoms progress.        Orders Placed This Encounter  Procedures   Urine Microalbumin w/creat. ratio   POCT HgB A1C    Meds ordered this encounter  Medications   MOUNJARO 10 MG/0.5ML Pen    Sig: Inject 10 mg into the skin  once a week.    Dispense:  2 mL    Refill:  2    Dose increase from 7.5 up to 10mg     Follow up plan: Return for 3 month fasting lab > 1 week later Annual Physical.  Future labs ordered for 06/17/24   Domingo Friend, DO [facility] 03/18/2024, 10:53 AM

## 2024-03-18 NOTE — Patient Instructions (Addendum)
 Thank you for coming to the office today.  Stop the Amlodipine  5mg  daily, due to swelling side effect.  Check Bp at home, on current medications, daily or repeat if needed Contact us  within 1-2 weeks with readings so we can review and adjust meds if needed  We may dose up on hydrochlorothiazide  if needed.  Recent Labs    11/10/23 0000 03/18/24 1043  HGBA1C 6.8 6.1*   Dose increase on Mounjaro from 7.5 up to 10mg   Caution with the Glimepiride  and may need to pause this if drop too low.  DUE for FASTING BLOOD WORK (no food or drink after midnight before the lab appointment, only water or coffee without cream/sugar on the morning of)  SCHEDULE "Lab Only" visit in the morning at the clinic for lab draw in 3 MONTHS   - Make sure Lab Only appointment is at about 1 week before your next appointment, so that results will be available  For Lab Results, once available within 2-3 days of blood draw, you can can log in to MyChart online to view your results and a brief explanation. Also, we can discuss results at next follow-up visit.   Please schedule a Follow-up Appointment to: Return for 3 month fasting lab > 1 week later Annual Physical.  If you have any other questions or concerns, please feel free to call the office or send a message through MyChart. You may also schedule an earlier appointment if necessary.  Additionally, you may be receiving a survey about your experience at our office within a few days to 1 week by e-mail or mail. We value your feedback.  Domingo Friend, DO [facility], [place]

## 2024-03-19 LAB — MICROALBUMIN / CREATININE URINE RATIO
Creatinine, Urine: 123 mg/dL (ref 20–275)
Microalb Creat Ratio: 81 mg/g{creat} — ABNORMAL HIGH (ref <30)
Microalb, Ur: 10 mg/dL

## 2024-03-21 ENCOUNTER — Telehealth: Payer: Self-pay | Admitting: Family Medicine

## 2024-03-21 NOTE — Telephone Encounter (Signed)
 LVM 03/21/2024 to schedule AWV. Please schedule office or virtual visits.  Brittany Cervantes; Care Guide Ambulatory Clinical Support [facility] Direct Dial: [phone]

## 2024-03-26 DIAGNOSIS — H35372 Puckering of macula, left eye: Secondary | ICD-10-CM | POA: Diagnosis not present

## 2024-03-26 DIAGNOSIS — H1045 Other chronic allergic conjunctivitis: Secondary | ICD-10-CM | POA: Diagnosis not present

## 2024-03-26 DIAGNOSIS — E119 Type 2 diabetes mellitus without complications: Secondary | ICD-10-CM | POA: Diagnosis not present

## 2024-03-26 DIAGNOSIS — H26493 Other secondary cataract, bilateral: Secondary | ICD-10-CM | POA: Diagnosis not present

## 2024-03-26 DIAGNOSIS — G4733 Obstructive sleep apnea (adult) (pediatric): Secondary | ICD-10-CM | POA: Diagnosis not present

## 2024-03-26 DIAGNOSIS — H353131 Nonexudative age-related macular degeneration, bilateral, early dry stage: Secondary | ICD-10-CM | POA: Diagnosis not present

## 2024-03-26 DIAGNOSIS — H40013 Open angle with borderline findings, low risk, bilateral: Secondary | ICD-10-CM | POA: Diagnosis not present

## 2024-03-26 LAB — HM DIABETES EYE EXAM

## 2024-03-27 DIAGNOSIS — Z1231 Encounter for screening mammogram for malignant neoplasm of breast: Secondary | ICD-10-CM | POA: Diagnosis not present

## 2024-03-27 LAB — HM MAMMOGRAPHY

## 2024-04-15 ENCOUNTER — Encounter: Payer: Self-pay | Admitting: Family Medicine

## 2024-04-15 ENCOUNTER — Ambulatory Visit (INDEPENDENT_AMBULATORY_CARE_PROVIDER_SITE_OTHER): Admitting: Family Medicine

## 2024-04-15 VITALS — BP 138/80 | HR 87 | Ht 68.0 in

## 2024-04-15 DIAGNOSIS — J0110 Acute frontal sinusitis, unspecified: Secondary | ICD-10-CM

## 2024-04-15 DIAGNOSIS — Z794 Long term (current) use of insulin: Secondary | ICD-10-CM

## 2024-04-15 DIAGNOSIS — E119 Type 2 diabetes mellitus without complications: Secondary | ICD-10-CM

## 2024-04-15 DIAGNOSIS — G43909 Migraine, unspecified, not intractable, without status migrainosus: Secondary | ICD-10-CM | POA: Diagnosis not present

## 2024-04-15 MED ORDER — GVOKE HYPOPEN 2-PACK 1 MG/0.2ML ~~LOC~~ SOAJ
1.0000 mg | SUBCUTANEOUS | 0 refills | Status: AC | PRN
Start: 2024-04-15 — End: ?

## 2024-04-15 MED ORDER — RIZATRIPTAN BENZOATE 10 MG PO TABS
10.0000 mg | ORAL_TABLET | ORAL | 0 refills | Status: DC | PRN
Start: 2024-04-15 — End: 2024-04-29

## 2024-04-15 MED ORDER — TRIAMCINOLONE ACETONIDE 55 MCG/ACT NA AERO
2.0000 | INHALATION_SPRAY | Freq: Every day | NASAL | 12 refills | Status: DC
Start: 2024-04-15 — End: 2024-09-23

## 2024-04-15 NOTE — Patient Instructions (Addendum)

## 2024-04-15 NOTE — Progress Notes (Signed)
 Subjective:    Patient ID: Brittany Cervantes, female    DOB: Apr 21, 1951, 73 y.o.   MRN: [id number]  Brittany Cervantes is a 73 y.o. female presenting on 04/15/2024 for Diabetes and Migraine   HPI   Discussed the use of AI scribe software for clinical note transcription with the patient, who gave verbal consent to proceed.  History of Present Illness   Brittany Cervantes is a 73 year old female with a history of migraines and diabetes who presents with left-sided headache and neck pain.  She has been experiencing a severe headache since last Friday, described as a migraine-like pain extending from the left side of her head down into her neck and ear. This headache is similar to her past migraines, although she has not had a migraine in two years. The pain improved somewhat after attending church and praying but has not completely resolved. She is concerned about the possibility of sinus involvement due to balance issues and wonders if there is something affecting her ear. She has been taking Advil and Tylenol daily for the pain but has not used any specific migraine medication recently. She recalls a previous prescription for rizatriptan, which she did not like due to its dissolving form affecting her tongue. No recent sinus infections, thick or foul-smelling discharge, or fever. She feels tired and experiences brain fog, which she attributes to her headaches and possibly her blood sugar fluctuations.  She discusses issues with her diabetes management, currently taking metformin , glyburide, and Tresiba . She experiences episodes of hypoglycemia, with blood sugar levels dropping to 40-49, causing nervousness. She has tried stopping glyburide, but her blood sugar levels then rise above 130. She is unsure how to manage these fluctuations and has been using high-sugar foods to counteract low blood sugar episodes.          04/15/2024    1:19 PM 03/18/2024   10:42 AM 12/19/2023   10:21 AM  Depression screen PHQ  2/9  Decreased Interest 0 0 0  Down, Depressed, Hopeless 0 0 0  PHQ - 2 Score 0 0 0  Altered sleeping 0 0   Tired, decreased energy 1 1   Change in appetite 0 0   Feeling bad or failure about yourself  0 0   Trouble concentrating 0 0   Moving slowly or fidgety/restless 0 0   Suicidal thoughts 0 0   PHQ-9 Score 1 1   Difficult doing work/chores  Not difficult at all        04/15/2024    1:20 PM 03/18/2024   10:42 AM 12/19/2023   10:21 AM  GAD 7 : Generalized Anxiety Score  Nervous, Anxious, on Edge 0 0 1  Control/stop worrying 0 0   Worry too much - different things 0 0   Trouble relaxing 0 0   Restless 0 0   Easily annoyed or irritable 0 0   Afraid - awful might happen 0 0   Total GAD 7 Score 0 0   Anxiety Difficulty   Somewhat difficult    Social History   Tobacco Use   Smoking status: Former    Types: Cigarettes   Smokeless tobacco: Never  Vaping Use   Vaping status: Never Used  Substance Use Topics   Alcohol  use: Yes   Drug use: Never    Review of Systems Per HPI unless specifically indicated above     Objective:     BP 138/80 (BP Location: Left Arm, Cuff Size: Normal)  Pulse 87   Ht 5\' 8"  (1.727 m)   SpO2 99%   BMI 32.29 kg/m   Wt Readings from Last 3 Encounters:  03/18/24 212 lb 6 oz (96.3 kg)  12/19/23 229 lb (103.9 kg)  05/10/23 225 lb (102.1 kg)    Physical Exam Vitals and nursing note reviewed.  Constitutional:      General: She is not in acute distress.    Appearance: She is well-developed. She is not diaphoretic.     Comments: Well-appearing, comfortable, cooperative  HENT:     Head: Normocephalic and atraumatic.     Right Ear: Ear canal and external ear normal. There is no impacted cerumen.     Left Ear: Ear canal and external ear normal. There is no impacted cerumen.     Ears:     Comments: Bilateral effusion some hazy appearance, no erythema. Eyes:     General:        Right eye: No discharge.        Left eye: No discharge.      Conjunctiva/sclera: Conjunctivae normal.  Neck:     Thyroid: No thyromegaly.  Cardiovascular:     Rate and Rhythm: Normal rate and regular rhythm.     Heart sounds: Normal heart sounds. No murmur heard. Pulmonary:     Effort: Pulmonary effort is normal. No respiratory distress.     Breath sounds: Normal breath sounds. No wheezing or rales.  Musculoskeletal:        General: Normal range of motion.     Cervical back: Normal range of motion and neck supple.  Lymphadenopathy:     Cervical: No cervical adenopathy.  Skin:    General: Skin is warm and dry.     Findings: Bruising (L dorsal forefoot) present. No erythema or rash.  Neurological:     Mental Status: She is alert and oriented to person, place, and time.  Psychiatric:        Behavior: Behavior normal.     Comments: Well groomed, good eye contact, normal speech and thoughts     Results for orders placed or performed in visit on 03/27/24  HM MAMMOGRAPHY   Collection Time: 03/27/24  2:48 PM  Result Value Ref Range   HM Mammogram 0-4 Bi-Rad 0-4 Bi-Rad, Self Reported Normal      Assessment & Plan:   Problem List Items Addressed This Visit     Diabetes mellitus type 2, insulin dependent (HCC)   Relevant Medications   GVOKE HYPOPEN 2-PACK 1 MG/0.2ML SOAJ   Other Visit Diagnoses       Acute non-recurrent frontal sinusitis    -  Primary   Relevant Medications   triamcinolone  (NASACORT ) 55 MCG/ACT AERO nasal inhaler     Episodic migraine       Relevant Medications   rizatriptan (MAXALT) 10 MG tablet     Long term current use of insulin (HCC)            Episodic Migraine History of migraines, have been gone for while now concern if returning. L sided, Severe headache consistent with migraine, possible sinus involvement. Prefers non-dissolving medication due to past adverse effects. - Prescribe rizatriptan in pill form for acute relief. Not ODT - Recommend over-the-counter Nasacort  for sinus symptoms for sinusitis  pressure   Sinus Congestion Cloudy fluid behind eardrum and swollen nasal passages suggest sinus congestion. Nasacort  preferred. - Recommend over-the-counter Nasacort . Not classic for sinus infection at this time.  Type 2 Diabetes Mellitus Experiencing hypoglycemia.  Glyburide contributes to low blood sugar. Emphasized avoiding hypoglycemia over achieving A1c of 6.1. Emergency plan with Gvoke discussed. - Discontinue glyburide. - Continue Mounjaro, metformin , and Tresiba  at 50 units. - Order Gvoke auto-injector pen for emergency management.  Foot Pain Ecchymosis and Soreness and sensitivity with no trauma history. Further evaluation needed if pain persists. - Consider x-ray if symptoms persist.  Medication Refill Issues Explained process and suggested MyChart for communication. - Use MyChart for refill requests. - Provide a heads-up a day or two before running out.        No orders of the defined types were placed in this encounter.   Meds ordered this encounter  Medications   rizatriptan (MAXALT) 10 MG tablet    Sig: Take 1 tablet (10 mg total) by mouth as needed for migraine. May repeat in 2 hours if needed    Dispense:  10 tablet    Refill:  0   triamcinolone  (NASACORT ) 55 MCG/ACT AERO nasal inhaler    Sig: Place 2 sprays into the nose daily.    Dispense:  1 each    Refill:  12   GVOKE HYPOPEN 2-PACK 1 MG/0.2ML SOAJ    Sig: Inject 1 mg into the skin as needed (hypoglycemia).    Dispense:  1 mL    Refill:  0    Follow up plan: Return if symptoms worsen or fail to improve.  Domingo Friend, DO [facility] 04/15/2024, 1:39 PM

## 2024-04-16 ENCOUNTER — Encounter: Payer: Self-pay | Admitting: Family Medicine

## 2024-04-16 DIAGNOSIS — M79672 Pain in left foot: Secondary | ICD-10-CM

## 2024-04-17 ENCOUNTER — Ambulatory Visit: Payer: Self-pay | Admitting: Family Medicine

## 2024-04-17 ENCOUNTER — Ambulatory Visit
Admission: RE | Admit: 2024-04-17 | Discharge: 2024-04-17 | Disposition: A | Discharge status: Home / Self Care | Source: Ambulatory Visit | Attending: Family Medicine | Admitting: Family Medicine

## 2024-04-17 DIAGNOSIS — M7732 Calcaneal spur, left foot: Secondary | ICD-10-CM | POA: Diagnosis not present

## 2024-04-17 DIAGNOSIS — S9032XA Contusion of left foot, initial encounter: Secondary | ICD-10-CM | POA: Diagnosis not present

## 2024-04-17 DIAGNOSIS — M79672 Pain in left foot: Secondary | ICD-10-CM | POA: Diagnosis not present

## 2024-04-24 ENCOUNTER — Encounter: Payer: Self-pay | Admitting: Family Medicine

## 2024-04-25 DIAGNOSIS — G4733 Obstructive sleep apnea (adult) (pediatric): Secondary | ICD-10-CM | POA: Diagnosis not present

## 2024-04-26 NOTE — Progress Notes (Unsigned)
 [facility] [address] [place], [place] [address]  Pulmonary Sleep Medicine   Office Visit Note  Patient Name: Brittany Cervantes DOB: 07/04/1951 MRN [id number]    Chief Complaint: Obstructive Sleep Apnea visit  Brief History:  Brittany Cervantes is seen today for an annual follow up visit for CPAP@ 7 cmH2O. The patient has a longstanding history of sleep apnea. Patient *** using PAP nightly.  The patient feels *** after sleeping with PAP.  The patient reports *** from PAP use. Reported sleepiness is  *** and the Epworth Sleepiness Score is *** out of 24. The patient *** take naps. The patient complains of the following: ***  The compliance download shows  compliance with an average use time of *** hours. The AHI is ***  The patient *** of limb movements disrupting sleep. The patient continues to require PAP therapy in order to eliminate sleep apnea.   ROS  General: (-) fever, (-) chills, (-) night sweat Nose and Sinuses: (-) nasal stuffiness or itchiness, (-) postnasal drip, (-) nosebleeds, (-) sinus trouble. Mouth and Throat: (-) sore throat, (-) hoarseness. Neck: (-) swollen glands, (-) enlarged thyroid, (-) neck pain. Respiratory: *** cough, *** shortness of breath, *** wheezing. Neurologic: *** numbness, *** tingling. Psychiatric: *** anxiety, *** depression   Current Medication: Outpatient Encounter Medications as of 04/29/2024  Medication Sig   ascorbic acid (VITAMIN C) 1000 MG tablet Take by mouth.   aspirin EC 81 MG tablet Take 1 tablet by mouth daily.   azelastine (ASTELIN) 0.1 % nasal spray Place into the nose.   cetirizine  (ZYRTEC ) 10 MG tablet Take 1 tablet (10 mg total) by mouth daily.   citalopram  (CELEXA ) 10 MG tablet Take 1 tablet (10 mg total) by mouth daily.   clobetasol ointment (TEMOVATE) 0.05 % Apply 1 Application topically 2 (two) times daily. (Patient not taking: Reported on 04/15/2024)   ferrous gluconate (FERGON) 324 MG tablet Take by mouth.   furosemide  (LASIX) 20 MG tablet Take 20 mg by mouth.   GVOKE HYPOPEN  2-PACK 1 MG/0.2ML SOAJ Inject 1 mg into the skin as needed (hypoglycemia).   hydrochlorothiazide  (HYDRODIURIL ) 12.5 MG tablet Take 1 tablet (12.5 mg total) by mouth daily.   metFORMIN  (GLUCOPHAGE ) 1000 MG tablet Take 1 tablet (1,000 mg total) by mouth 2 (two) times daily with a meal.   MOUNJARO  10 MG/0.5ML Pen Inject 10 mg into the skin once a week.   nortriptyline (PAMELOR) 10 MG capsule    ONETOUCH VERIO test strip Use to check blood sugar up to 2 to 3 x per day   rizatriptan  (MAXALT ) 10 MG tablet Take 1 tablet (10 mg total) by mouth as needed for migraine. May repeat in 2 hours if needed   rosuvastatin (CRESTOR) 20 MG tablet Take by mouth.   TRESIBA  FLEXTOUCH 100 UNIT/ML FlexTouch Pen Inject 50 Units into the skin at bedtime. Adjusted dose   triamcinolone  (NASACORT ) 55 MCG/ACT AERO nasal inhaler Place 2 sprays into the nose daily.   triamcinolone  cream (KENALOG ) 0.5 % Apply 1 Application topically 2 (two) times daily. To affected areas, for up to 2 weeks.   No facility-administered encounter medications on file as of 04/29/2024.    Surgical History: Past Surgical History:  Procedure Laterality Date   CATARACT EXTRACTION Bilateral    CESAREAN SECTION  1984   CHOLECYSTECTOMY  1982   KNEE ARTHROSCOPY Left    LYMPH NODE BIOPSY  2005   tonsillectony     TOTAL HIP ARTHROPLASTY Left  2007   TOTAL HIP ARTHROPLASTY Right    2014    Medical History: Past Medical History:  Diagnosis Date   Diabetes mellitus without complication (HCC)    Fatty liver    Fibromyalgia    Heart murmur    Hypercholesteremia    Hypertension    Migraines    Neuropathy    Osteoporosis    Sleep apnea     Family History: Non contributory to the present illness  Social History: Social History   Socioeconomic History   Marital status: Married    Spouse name: Not on file   Number of children: Not on file   Years of education: Not on file    Highest education level: Not on file  Occupational History   Not on file  Tobacco Use   Smoking status: Former    Types: Cigarettes   Smokeless tobacco: Never  Vaping Use   Vaping status: Never Used  Substance and Sexual Activity   Alcohol  use: Yes   Drug use: Never   Sexual activity: Not on file  Other Topics Concern   Not on file  Social History Narrative   Not on file   Social Drivers of Health   Financial Resource Strain: Low Risk  (01/17/2024)   Received from [facility]   Overall Financial Resource Strain (CARDIA)    Difficulty of Paying Living Expenses: Not hard at all  Food Insecurity: No Food Insecurity (01/17/2024)   Received from [facility]   Hunger Vital Sign    Within the past 12 months, you worried that your food would run out before you got the money to buy more.: Never true    Within the past 12 months, the food you bought just didn't last and you didn't have money to get more.: Never true  Transportation Needs: No Transportation Needs (01/17/2024)   Received from [facility] - Transportation    In the past 12 months, has lack of transportation kept you from medical appointments or from getting medications?: No    Lack of Transportation (Non-Medical): No  Physical Activity: Not on file  Stress: Not on file  Social Connections: Not on file  Intimate Partner Violence: Not on file    Vital Signs: There were no vitals taken for this visit. There is no height or weight on file to calculate BMI.    Examination: General Appearance: The patient is well-developed, well-nourished, and in no distress. Neck Circumference: 40 cm Skin: Gross inspection of skin unremarkable. Head: normocephalic, no gross deformities. Eyes: no gross deformities noted. ENT: ears appear grossly normal Neurologic: Alert and oriented. No involuntary movements.  STOP BANG RISK ASSESSMENT S (snore) Have you been told that  you snore?     NO   T (tired) Are you often tired, fatigued, or sleepy during the day?   NO  O (obstruction) Do you stop breathing, choke, or gasp during sleep? NO   P (pressure) Do you have or are you being treated for high blood pressure? YES   B (BMI) Is your body index greater than 35 kg/m? NO   A (age) Are you 25 years old or older? YES   N (neck) Do you have a neck circumference greater than 16 inches?   NO   G (gender) Are you a female? NO   TOTAL STOP/BANG "YES" ANSWERS 2       A STOP-Bang score of 2 or less is considered  low risk, and a score of 5 or more is high risk for having either moderate or severe OSA. For people who score 3 or 4, doctors may need to perform further assessment to determine how likely they are to have OSA.         EPWORTH SLEEPINESS SCALE:  Scale:  (0)= no chance of dozing; (1)= slight chance of dozing; (2)= moderate chance of dozing; (3)= high chance of dozing  Chance  Situtation    Sitting and reading: ***    Watching TV: ***    Sitting Inactive in public: ***    As a passenger in car: ***      Lying down to rest: ***    Sitting and talking: ***    Sitting quielty after lunch: ***    In a car, stopped in traffic: ***   TOTAL SCORE:   *** out of 24    SLEEP STUDIES:  No studies on file    CPAP COMPLIANCE DATA:  Date Range: ***  Average Daily Use: *** hours  Median Use: ***  Compliance for > 4 Hours: *** days  AHI: *** respiratory events per hour  Days Used: ***  Mask Leak: ***  95th Percentile Pressure: ***         LABS: Recent Results (from the past 2160 hours)  POCT HgB A1C     Status: Abnormal   Collection Time: 03/18/24 10:43 AM  Result Value Ref Range   Hemoglobin A1C 6.1 (A) 4.0 - 5.6 %   HbA1c POC (<> result, manual entry)     HbA1c, POC (prediabetic range)     HbA1c, POC (controlled diabetic range)    Urine Microalbumin w/creat. ratio     Status: Abnormal   Collection Time: 03/18/24 11:26  AM  Result Value Ref Range   Creatinine, Urine 123 20 - 275 mg/dL   Microalb, Ur 82.9 mg/dL    Comment: Reference Range Not established    Microalb Creat Ratio 81 (H) <30 mg/g creat    Comment: . The ADA defines abnormalities in albumin excretion as follows: Aaron Aas Albuminuria Category        Result (mg/g creatinine) . Normal to Mildly increased   <30 Moderately increased         30-299  Severely increased           > OR = 300 . The ADA recommends that at least two of three specimens collected within a 3-6 month period be abnormal before considering a patient to be within a diagnostic category.   HM MAMMOGRAPHY     Status: None   Collection Time: 03/27/24  2:48 PM  Result Value Ref Range   HM Mammogram 0-4 Bi-Rad 0-4 Bi-Rad, Self Reported Normal    Comment: ABST BY HIM    Radiology: DG Foot Complete Left Result Date: 04/17/2024 CLINICAL DATA:  73 year old female with left foot pain and bruising for 2 weeks, uncertain injury. EXAM: LEFT FOOT - COMPLETE 3+ VIEW COMPARISON:  None Available. FINDINGS: Three views 1147 hours. Bone mineralization is within normal limits for age. Calcaneus appears intact with degenerative spurring. Hindfoot joint spaces and alignment within normal limits for age. Grossly intact distal tibia and fibula. Metacarpals and phalanges appear intact and aligned. Joint spaces are within normal limits for age. No acute osseous abnormality identified. Evidence of distal left foot soft tissue swelling, especially the dorsal foot. No soft tissue gas. No radiopaque foreign body identified. IMPRESSION: Distal left foot soft tissue swelling.  No acute osseous abnormality or radiopaque foreign body identified. Electronically Signed   By: Marlise Simpers M.D.   On: 04/17/2024 12:07    No results found.  DG Foot Complete Left Result Date: 04/17/2024 CLINICAL DATA:  73 year old female with left foot pain and bruising for 2 weeks, uncertain injury. EXAM: LEFT FOOT - COMPLETE 3+ VIEW  COMPARISON:  None Available. FINDINGS: Three views 1147 hours. Bone mineralization is within normal limits for age. Calcaneus appears intact with degenerative spurring. Hindfoot joint spaces and alignment within normal limits for age. Grossly intact distal tibia and fibula. Metacarpals and phalanges appear intact and aligned. Joint spaces are within normal limits for age. No acute osseous abnormality identified. Evidence of distal left foot soft tissue swelling, especially the dorsal foot. No soft tissue gas. No radiopaque foreign body identified. IMPRESSION: Distal left foot soft tissue swelling. No acute osseous abnormality or radiopaque foreign body identified. Electronically Signed   By: Marlise Simpers M.D.   On: 04/17/2024 12:07      Assessment and Plan: Patient Active Problem List   Diagnosis Date Noted   Stasis dermatitis of both legs 03/18/2024   Fibromyalgia 12/21/2023   CPAP use counseling 05/01/2023   Restless leg syndrome 05/01/2023   Osteoporosis 10/06/2022   Osteoarthritis of ankle and foot 10/06/2022   Diabetes mellitus type 2, insulin dependent (HCC) 05/11/2021   Fatty liver 05/11/2021   Major depressive disorder, recurrent, in full remission (HCC) 05/11/2021   OSA (obstructive sleep apnea) 03/29/2021   Bradycardia 03/03/2021   Gastroesophageal reflux disease 11/18/2020   Seasonal allergic rhinitis due to pollen 11/18/2020   Thyroid nodule 05/27/2019   Osteoarthritis 11/13/2018   Arthralgia 08/23/2018   Type 2 diabetes mellitus with hyperglycemia (HCC) 12/05/2016   Essential hypertension 08/01/2016      The patient *** tolerate PAP and reports *** benefit from PAP use. The patient was reminded how to *** and advised to ***. The patient was also counselled on ***. The compliance is ***. The AHI is ***.   ***  General Counseling: I have discussed the findings of the evaluation and examination with Brittany Cervantes.  I have also discussed any further diagnostic evaluation thatmay be  needed or ordered today. Brittany Cervantes verbalizes understanding of the findings of todays visit. We also reviewed her medications today and discussed drug interactions and side effects including but not limited excessive drowsiness and altered mental states. We also discussed that there is always a risk not just to her but also people around her. she has been encouraged to call the office with any questions or concerns that should arise related to todays visit.  No orders of the defined types were placed in this encounter.       I have personally obtained a history, examined the patient, evaluated laboratory and imaging results, formulated the assessment and plan and placed orders.  Cordie Deters, MD [facility] Diplomate ABMS Pulmonary Critical Care Medicine and Sleep Medicine

## 2024-04-29 ENCOUNTER — Ambulatory Visit (INDEPENDENT_AMBULATORY_CARE_PROVIDER_SITE_OTHER): Payer: Self-pay | Admitting: Internal Medicine

## 2024-04-29 VITALS — BP 150/86 | HR 88 | Resp 16 | Ht 68.0 in | Wt 207.0 lb

## 2024-04-29 DIAGNOSIS — G4733 Obstructive sleep apnea (adult) (pediatric): Secondary | ICD-10-CM | POA: Diagnosis not present

## 2024-04-29 DIAGNOSIS — Z7189 Other specified counseling: Secondary | ICD-10-CM

## 2024-04-29 DIAGNOSIS — G2581 Restless legs syndrome: Secondary | ICD-10-CM | POA: Diagnosis not present

## 2024-04-29 NOTE — Patient Instructions (Signed)

## 2024-05-01 DIAGNOSIS — M19042 Primary osteoarthritis, left hand: Secondary | ICD-10-CM | POA: Diagnosis not present

## 2024-05-01 DIAGNOSIS — M19041 Primary osteoarthritis, right hand: Secondary | ICD-10-CM | POA: Diagnosis not present

## 2024-05-14 ENCOUNTER — Other Ambulatory Visit: Payer: Self-pay | Admitting: Family Medicine

## 2024-05-14 DIAGNOSIS — E119 Type 2 diabetes mellitus without complications: Secondary | ICD-10-CM

## 2024-05-16 NOTE — Telephone Encounter (Signed)
 Requested medication (s) are due for refill today: yes  Requested medication (s) are on the active medication list: yes  Last refill:  03/18/24 2 ml 2 RF  Future visit scheduled: yes  Notes to clinic:  med not assigned to a protocol   Requested Prescriptions  Pending Prescriptions Disp Refills   MOUNJARO  10 MG/0.5ML Pen [Pharmacy Med Name: MOUNJARO  10 MG/0.5ML SUBQ SOLN ML] 2 mL 2    Sig: INJECT 10MG  SUBCUTANEOUSLY ONCE A WEEK     Off-Protocol Failed - 05/16/2024  1:09 PM      Failed - Medication not assigned to a protocol, review manually.      Passed - Valid encounter within last 12 months    Recent Outpatient Visits           1 month ago Acute non-recurrent frontal sinusitis   [place] [facility] [place], Marsa PARAS, DO   1 month ago Diabetes mellitus type 2, insulin dependent [facility])   [place] [facility] [place], Marsa PARAS, DO   4 months ago Diabetes mellitus type 2, insulin dependent [facility])   [place] [facility] [place], Marsa PARAS, [place]

## 2024-05-21 ENCOUNTER — Encounter: Payer: Self-pay | Admitting: Family Medicine

## 2024-05-27 DIAGNOSIS — G4733 Obstructive sleep apnea (adult) (pediatric): Secondary | ICD-10-CM | POA: Diagnosis not present

## 2024-06-06 DIAGNOSIS — E782 Mixed hyperlipidemia: Secondary | ICD-10-CM | POA: Diagnosis not present

## 2024-06-17 ENCOUNTER — Other Ambulatory Visit

## 2024-06-17 DIAGNOSIS — E119 Type 2 diabetes mellitus without complications: Secondary | ICD-10-CM

## 2024-06-17 DIAGNOSIS — Z794 Long term (current) use of insulin: Secondary | ICD-10-CM | POA: Diagnosis not present

## 2024-06-17 DIAGNOSIS — Z0000 Encounter for general adult medical examination without abnormal findings: Secondary | ICD-10-CM | POA: Diagnosis not present

## 2024-06-17 DIAGNOSIS — E1169 Type 2 diabetes mellitus with other specified complication: Secondary | ICD-10-CM | POA: Diagnosis not present

## 2024-06-17 DIAGNOSIS — E785 Hyperlipidemia, unspecified: Secondary | ICD-10-CM | POA: Diagnosis not present

## 2024-06-17 DIAGNOSIS — I10 Essential (primary) hypertension: Secondary | ICD-10-CM | POA: Diagnosis not present

## 2024-06-17 LAB — COMPREHENSIVE METABOLIC PANEL WITH GFR
AG Ratio: 2.2 (calc) (ref 1.0–2.5)
ALT: 18 U/L (ref 6–29)
AST: 16 U/L (ref 10–35)
Albumin: 4.6 g/dL (ref 3.6–5.1)
Alkaline phosphatase (APISO): 40 U/L (ref 37–153)
BUN: 11 mg/dL (ref 7–25)
CO2: 30 mmol/L (ref 20–32)
Calcium: 9.7 mg/dL (ref 8.6–10.4)
Chloride: 99 mmol/L (ref 98–110)
Creat: 0.65 mg/dL (ref 0.60–1.00)
Globulin: 2.1 g/dL (ref 1.9–3.7)
Glucose, Bld: 93 mg/dL (ref 65–99)
Potassium: 4.3 mmol/L (ref 3.5–5.3)
Sodium: 137 mmol/L (ref 135–146)
Total Bilirubin: 0.4 mg/dL (ref 0.2–1.2)
Total Protein: 6.7 g/dL (ref 6.1–8.1)
eGFR: 93 mL/min/1.73m2 (ref >=60)

## 2024-06-17 LAB — CBC WITH DIFFERENTIAL/PLATELET
Absolute Lymphocytes: 772 {cells}/uL — ABNORMAL LOW (ref 850–3900)
Absolute Monocytes: 289 {cells}/uL (ref 200–950)
Basophils Absolute: 39 {cells}/uL (ref 0–200)
Basophils Relative: 1 %
Eosinophils Absolute: 129 {cells}/uL (ref 15–500)
Eosinophils Relative: 3.3 %
HCT: 35.2 % (ref 35.0–45.0)
Hemoglobin: 11.8 g/dL (ref 11.7–15.5)
MCH: 29.9 pg (ref 27.0–33.0)
MCHC: 33.5 g/dL (ref 32.0–36.0)
MCV: 89.1 fL (ref 80.0–100.0)
MPV: 10.1 fL (ref 7.5–12.5)
Monocytes Relative: 7.4 %
Neutro Abs: 2672 {cells}/uL (ref 1500–7800)
Neutrophils Relative %: 68.5 %
Platelets: 216 Thousand/uL (ref 140–400)
RBC: 3.95 Million/uL (ref 3.80–5.10)
RDW: 12 % (ref 11.0–15.0)
Total Lymphocyte: 19.8 %
WBC: 3.9 Thousand/uL (ref 3.8–10.8)

## 2024-06-17 LAB — LIPID PANEL
Cholesterol: 118 mg/dL (ref <200)
HDL: 48 mg/dL — ABNORMAL LOW (ref >=50)
LDL Cholesterol (Calc): 56 mg/dL
Non-HDL Cholesterol (Calc): 70 mg/dL (ref <130)
Total CHOL/HDL Ratio: 2.5 (calc) (ref <5.0)
Triglycerides: 68 mg/dL (ref <150)

## 2024-06-17 LAB — HEMOGLOBIN A1C
Hgb A1c MFr Bld: 6.7 % — ABNORMAL HIGH (ref <5.7)
Mean Plasma Glucose: 146 mg/dL
eAG (mmol/L): 8.1 mmol/L

## 2024-06-17 LAB — TSH: TSH: 1.27 m[IU]/L (ref 0.40–4.50)

## 2024-06-24 ENCOUNTER — Other Ambulatory Visit: Payer: Self-pay | Admitting: Family Medicine

## 2024-06-24 ENCOUNTER — Ambulatory Visit (INDEPENDENT_AMBULATORY_CARE_PROVIDER_SITE_OTHER): Admitting: Family Medicine

## 2024-06-24 ENCOUNTER — Encounter: Payer: Self-pay | Admitting: Family Medicine

## 2024-06-24 VITALS — BP 140/60 | HR 79 | Ht 68.0 in | Wt 200.0 lb

## 2024-06-24 DIAGNOSIS — D509 Iron deficiency anemia, unspecified: Secondary | ICD-10-CM

## 2024-06-24 DIAGNOSIS — G4733 Obstructive sleep apnea (adult) (pediatric): Secondary | ICD-10-CM | POA: Diagnosis not present

## 2024-06-24 DIAGNOSIS — G43909 Migraine, unspecified, not intractable, without status migrainosus: Secondary | ICD-10-CM

## 2024-06-24 DIAGNOSIS — Z0000 Encounter for general adult medical examination without abnormal findings: Secondary | ICD-10-CM | POA: Diagnosis not present

## 2024-06-24 DIAGNOSIS — E119 Type 2 diabetes mellitus without complications: Secondary | ICD-10-CM | POA: Diagnosis not present

## 2024-06-24 DIAGNOSIS — Z794 Long term (current) use of insulin: Secondary | ICD-10-CM

## 2024-06-24 DIAGNOSIS — Z7985 Long-term (current) use of injectable non-insulin antidiabetic drugs: Secondary | ICD-10-CM

## 2024-06-24 DIAGNOSIS — I10 Essential (primary) hypertension: Secondary | ICD-10-CM | POA: Diagnosis not present

## 2024-06-24 DIAGNOSIS — E1169 Type 2 diabetes mellitus with other specified complication: Secondary | ICD-10-CM | POA: Diagnosis not present

## 2024-06-24 DIAGNOSIS — E785 Hyperlipidemia, unspecified: Secondary | ICD-10-CM | POA: Diagnosis not present

## 2024-06-24 DIAGNOSIS — Z1211 Encounter for screening for malignant neoplasm of colon: Secondary | ICD-10-CM

## 2024-06-24 DIAGNOSIS — F3342 Major depressive disorder, recurrent, in full remission: Secondary | ICD-10-CM

## 2024-06-24 NOTE — Patient Instructions (Addendum)
 Thank you for coming to the office today.  Referral to [facility] Dr Cloria or one of his associates. Stay tuned for update from [facility] on Colonoscopy scheduling.  Recommend Shingrix vaccine 2 doses, at the pharmacy  Keep dose Losartan No change at this time.  Keep Mounjaro  10mg   PAUSE or STOP the Iron supplement. We can repeat in 3 months  Previously took Metoprolol - please discuss that with your Cardiology team if they suggest something else or a different dosage.  You have been referred for a Coronary Calcium Score Cardiac CT Scan. This is a screening test for patients aged 2-50+ with cardiovascular risk factors or who are healthy but would be interested in Cardiovascular Screening for heart disease. Even if there is a family history of heart disease, this imaging can be useful. Typically it can be done every 5+ years or at a different timeline we agree on  The scan will look at the chest and mainly focus on the heart and identify early signs of calcium build up or blockages within the heart arteries. It is not 100% accurate for identifying blockages or heart disease, but it is useful to help us  predict who may have some early changes or be at risk in the future for a heart attack or cardiovascular problem.  The results are reviewed by a Cardiologist and they will document the results. It should become available on MyChart. Typically the results are divided into percentiles based on other patients of the same demographic and age. So it will compare your risk to others similar to you. If you have a higher score >99 or higher percentile >75%tile, it is recommended to consider Statin cholesterol therapy and or referral to Cardiologist. I will try to help explain your results and if we have questions we can contact the Cardiologist.  You will be contacted for scheduling. Usually it is done at any imaging facility through [facility], [facility] or [facility].  The  cost is $99 flat fee total and it does not go through insurance, so no authorization is required.   Please schedule a Follow-up Appointment to: Return in about 3 months (around 09/24/2024) for 3 month fasting lab then 1 week later Follow-up DM, Anemia/Iron results.  If you have any other questions or concerns, please feel free to call the office or send a message through MyChart. You may also schedule an earlier appointment if necessary.  Additionally, you may be receiving a survey about your experience at our office within a few days to 1 week by e-mail or mail. We value your feedback.  Marsa Officer, DO [facility] [place]

## 2024-06-24 NOTE — Progress Notes (Signed)
 Subjective:    Patient ID: Brittany Cervantes, female    DOB: 1951/10/19, 72 y.o.   MRN: [id number]  Brittany Cervantes is a 73 y.o. female presenting on 06/24/2024 for Medical Management of Chronic Issues (Lab results and needs refills on medications)   HPI  Discussed the use of AI scribe software for clinical note transcription with the patient, who gave verbal consent to proceed.  History of Present Illness   Brittany Cervantes is a 73 year old female who presents for an annual physical exam.  Type 2 Diabetes Glycemic control and diabetes management - Hemoglobin A1c increased from 6.1 to 6.7 since last visit - Improved on GLP1 - Attributes increase in A1c to discontinuation of glyburide - Currently taking Mounjaro  10 mg - Diabetic eye exam possibly completed in May, but records not yet received. [facility]  Hypertension and blood pressure monitoring - Occasional elevated blood pressure readings up to 140-145 mmHg systolic - Currently managed with losartan 25 mg and hydrochlorothiazide  12.5mg  - Previously labeled as allergic to losartan, but recently restarted by cardiologist and now doing well  Dyslipidemia management - Cholesterol well-controlled on rosuvastatin 20 mg - Most recent total cholesterol 118 mg/dL, LDL 56 mg/dL  Tachycardia and cardiac history - History of elevated heart rate - Previously treated with metoprolol, discontinued due to bradycardia - Concerned about long-term effects of elevated heart rate  Cold sensitivity - Frequent cold sensation despite use of multiple blankets and a heating pad - Attributes some symptoms to air conditioning, but uncertain of underlying cause  Iron deficiency and gastrointestinal bleeding risk - Long-term use of iron supplements (325 mg daily) for history of low iron levels - Considering discontinuation of iron supplementation - History of bleeding ulcer - Concerned about potential internal bleeding  Immunization status and  allergies - Allergic reaction to COVID vaccine, has not received any boosters  Colorectal cancer screening - Colonoscopy completed June 2020 - Due for repeat colonoscopy in 2025 - Prefers to have colonoscopy at [facility] with Dr. Lorrene Rase         06/24/2024    2:10 PM 04/15/2024    1:19 PM 03/18/2024   10:42 AM  Depression screen PHQ 2/9  Decreased Interest 0 0 0  Down, Depressed, Hopeless 0 0 0  PHQ - 2 Score 0 0 0  Altered sleeping 1 0 0  Tired, decreased energy 0 1 1  Change in appetite 0 0 0  Feeling bad or failure about yourself  0 0 0  Trouble concentrating 0 0 0  Moving slowly or fidgety/restless 0 0 0  Suicidal thoughts 0 0 0  PHQ-9 Score 1 1 1   Difficult doing work/chores Not difficult at all  Not difficult at all       06/24/2024    2:10 PM 04/15/2024    1:20 PM 03/18/2024   10:42 AM 12/19/2023   10:21 AM  GAD 7 : Generalized Anxiety Score  Nervous, Anxious, on Edge 1 0 0 1  Control/stop worrying 0 0 0   Worry too much - different things 0 0 0   Trouble relaxing 0 0 0   Restless 0 0 0   Easily annoyed or irritable 0 0 0   Afraid - awful might happen 0 0 0   Total GAD 7 Score 1 0 0   Anxiety Difficulty Not difficult at all   Somewhat difficult     Past Medical History:  Diagnosis Date   Diabetes mellitus without complication (HCC)  Fatty liver    Fibromyalgia    Heart murmur    Hypercholesteremia    Hypertension    Migraines    Neuropathy    Osteoporosis    Sleep apnea    Past Surgical History:  Procedure Laterality Date   CATARACT EXTRACTION Bilateral    CESAREAN SECTION  1984   CHOLECYSTECTOMY  1982   KNEE ARTHROSCOPY Left    LYMPH NODE BIOPSY  2005   tonsillectony     TOTAL HIP ARTHROPLASTY Left    2007   TOTAL HIP ARTHROPLASTY Right    2014   Social History   Socioeconomic History   Marital status: Married    Spouse name: Not on file   Number of children: Not on file   Years of education: Not on file   Highest education level: Not on  file  Occupational History   Not on file  Tobacco Use   Smoking status: Former    Types: Cigarettes   Smokeless tobacco: Never  Vaping Use   Vaping status: Never Used  Substance and Sexual Activity   Alcohol  use: Yes   Drug use: Never   Sexual activity: Not on file  Other Topics Concern   Not on file  Social History Narrative   Not on file   Social Drivers of Health   Financial Resource Strain: Low Risk  (01/17/2024)   Received from [facility]   Overall Financial Resource Strain (CARDIA)    Difficulty of Paying Living Expenses: Not hard at all  Food Insecurity: No Food Insecurity (01/17/2024)   Received from [facility]   Hunger Vital Sign    Within the past 12 months, you worried that your food would run out before you got the money to buy more.: Never true    Within the past 12 months, the food you bought just didn't last and you didn't have money to get more.: Never true  Transportation Needs: No Transportation Needs (01/17/2024)   Received from [facility] - Transportation    In the past 12 months, has lack of transportation kept you from medical appointments or from getting medications?: No    Lack of Transportation (Non-Medical): No  Physical Activity: Not on file  Stress: Not on file  Social Connections: Not on file  Intimate Partner Violence: Patient Unable To Answer (06/06/2024)   Received from [facility]   Humiliation, Afraid, Rape, and Kick questionnaire    Within the last year, have you been afraid of your partner or ex-partner?: Patient unable to answer    Within the last year, have you been humiliated or emotionally abused in other ways by your partner or ex-partner?: Patient unable to answer    Within the last year, have you been kicked, hit, slapped, or otherwise physically hurt by your partner or ex-partner?: Patient unable to answer    Within the last year, have you been raped or forced to  have any kind of sexual activity by your partner or ex-partner?: Patient unable to answer   History reviewed. No pertinent family history. Current Outpatient Medications on File Prior to Visit  Medication Sig   ascorbic acid (VITAMIN C) 1000 MG tablet Take by mouth.   aspirin EC 81 MG tablet Take 1 tablet by mouth daily.   azelastine (ASTELIN) 0.1 % nasal spray Place into the nose.   cetirizine  (ZYRTEC ) 10 MG tablet Take 1 tablet (10 mg total) by mouth daily.  citalopram  (CELEXA ) 10 MG tablet Take 1 tablet (10 mg total) by mouth daily.   ferrous gluconate (FERGON) 324 MG tablet Take by mouth.   furosemide (LASIX) 20 MG tablet Take 20 mg by mouth.   GVOKE HYPOPEN  2-PACK 1 MG/0.2ML SOAJ Inject 1 mg into the skin as needed (hypoglycemia).   hydrochlorothiazide  (HYDRODIURIL ) 12.5 MG tablet Take 1 tablet (12.5 mg total) by mouth daily.   losartan (COZAAR) 25 MG tablet Take 25 mg by mouth daily.   metFORMIN  (GLUCOPHAGE ) 1000 MG tablet Take 1 tablet (1,000 mg total) by mouth 2 (two) times daily with a meal.   MOUNJARO  10 MG/0.5ML Pen INJECT 10MG  SUBCUTANEOUSLY ONCE A WEEK   nortriptyline (PAMELOR) 10 MG capsule    ONETOUCH VERIO test strip Use to check blood sugar up to 2 to 3 x per day   rizatriptan  (MAXALT -MLT) 10 MG disintegrating tablet Take by mouth.   rosuvastatin (CRESTOR) 20 MG tablet Take by mouth.   TRESIBA  FLEXTOUCH 100 UNIT/ML FlexTouch Pen Inject 50 Units into the skin at bedtime. Adjusted dose   triamcinolone  (NASACORT ) 55 MCG/ACT AERO nasal inhaler Place 2 sprays into the nose daily.   clobetasol ointment (TEMOVATE) 0.05 % Apply 1 Application topically 2 (two) times daily. (Patient not taking: Reported on 04/15/2024)   triamcinolone  cream (KENALOG ) 0.5 % Apply 1 Application topically 2 (two) times daily. To affected areas, for up to 2 weeks. (Patient not taking: Reported on 06/24/2024)   No current facility-administered medications on file prior to visit.    Review of Systems   Constitutional:  Negative for activity change, appetite change, chills, diaphoresis, fatigue and fever.  HENT:  Negative for congestion and hearing loss.   Eyes:  Negative for visual disturbance.  Respiratory:  Negative for cough, chest tightness, shortness of breath and wheezing.   Cardiovascular:  Negative for chest pain, palpitations and leg swelling.  Gastrointestinal:  Negative for abdominal pain, constipation, diarrhea, nausea and vomiting.  Genitourinary:  Negative for dysuria, frequency and hematuria.  Musculoskeletal:  Negative for arthralgias and neck pain.  Skin:  Negative for rash.  Neurological:  Negative for dizziness, weakness, light-headedness, numbness and headaches.  Hematological:  Negative for adenopathy.  Psychiatric/Behavioral:  Negative for behavioral problems, dysphoric mood and sleep disturbance.    Per HPI unless specifically indicated above     Objective:    BP (!) 140/60 (BP Location: Left Arm, Cuff Size: Normal)   Pulse 79   Ht 5' 8 (1.727 m)   Wt 200 lb (90.7 kg)   SpO2 96%   BMI 30.41 kg/m   Wt Readings from Last 3 Encounters:  06/24/24 200 lb (90.7 kg)  04/29/24 207 lb (93.9 kg)  03/18/24 212 lb 6 oz (96.3 kg)    Physical Exam Vitals and nursing note reviewed.  Constitutional:      General: She is not in acute distress.    Appearance: She is well-developed. She is not diaphoretic.     Comments: Well-appearing, comfortable, cooperative  HENT:     Head: Normocephalic and atraumatic.  Eyes:     General:        Right eye: No discharge.        Left eye: No discharge.     Conjunctiva/sclera: Conjunctivae normal.     Pupils: Pupils are equal, round, and reactive to light.  Neck:     Thyroid: No thyromegaly.     Vascular: No carotid bruit.  Cardiovascular:     Rate and Rhythm: Normal rate  and regular rhythm.     Pulses: Normal pulses.     Heart sounds: Normal heart sounds. No murmur heard. Pulmonary:     Effort: Pulmonary effort is  normal. No respiratory distress.     Breath sounds: Normal breath sounds. No wheezing or rales.  Abdominal:     General: Bowel sounds are normal. There is no distension.     Palpations: Abdomen is soft. There is no mass.     Tenderness: There is no abdominal tenderness.  Musculoskeletal:        General: No tenderness. Normal range of motion.     Cervical back: Normal range of motion and neck supple.     Right lower leg: No edema.     Left lower leg: No edema.     Comments: Upper / Lower Extremities: - Normal muscle tone, strength bilateral upper extremities 5/5, lower extremities 5/5  Lymphadenopathy:     Cervical: No cervical adenopathy.  Skin:    General: Skin is warm and dry.     Findings: No erythema or rash.  Neurological:     Mental Status: She is alert and oriented to person, place, and time.     Comments: Distal sensation intact to light touch all extremities  Psychiatric:        Mood and Affect: Mood normal.        Behavior: Behavior normal.        Thought Content: Thought content normal.     Comments: Well groomed, good eye contact, normal speech and thoughts     Diabetic Foot Exam - Simple   Simple Foot Form Diabetic Foot exam was performed with the following findings: Yes 06/24/2024  2:59 PM  Visual Inspection No deformities, no ulcerations, no other skin breakdown bilaterally: Yes Sensation Testing Intact to touch and monofilament testing bilaterally: Yes Pulse Check Posterior Tibialis and Dorsalis pulse intact bilaterally: Yes Comments      Colonoscopy  Narrative  _______________________________________________________________________________ Patient Name: Brittany Cervantes         Procedure Date: 04/11/2019 11:30 AM MRN: [id number]                     Date of Birth: [birth year] Admit Type: Outpatient                Age: 72 Room: [facility] GI PROCEDURES 02        Gender: Female Note Status: Finalized                Instrument Name: [id number]  [id number] _______________________________________________________________________________  Procedure:         Colonoscopy Indications:       Chronic diarrhea, Clinically significant diarrhea of                    unexplained origin, Melena Providers:         CRAIG GEORGI RASE, MD, INOCENTE DOROTHA GAL, RN, Loretta Rod, Technician Referring MD:      ELEANOR TEDDI SAUCER, MD (Referring MD) Medicines:         Propofol per Anesthesia Complications:     No immediate complications. _______________________________________________________________________________ Procedure:         Pre-Anesthesia Assessment:                    - Prior to the procedure, a History and Physical was  performed, and patient medications and allergies were                    reviewed. The patient's tolerance of previous anesthesia                    was also reviewed. The risks and benefits of the procedure                    and the sedation options and risks were discussed with the                    patient. All questions were answered, and informed consent                    was obtained. Prior Anticoagulants: The patient has taken                    no previous anticoagulant or antiplatelet agents except                    for aspirin. ASA Grade Assessment: III - A patient with                    severe systemic disease. After reviewing the risks and                    benefits, the patient was deemed in satisfactory condition                    to undergo the procedure.                    After obtaining informed consent, the scope was passed                    under direct vision. Throughout the procedure, the                    patient's blood pressure, pulse, and oxygen saturations                    were monitored continuously. The Colonoscope was                    introduced through the anus and advanced to the the                    descending colon. The terminal  ileum, ileocecal valve,                    appendiceal orifice, and rectum were photographed. The                    quality of the bowel preparation was good.                                                                                Findings:      The perianal and digital rectal examinations were normal.      Internal hemorrhoids were found during retroflexion.      Anal papilla(e) were  hypertrophied.      A few large-mouthed diverticula were found in the sigmoid colon.      The exam was otherwise normal throughout the examined colon.      Biopsies for histology were taken with a cold forceps from the cecum,      ascending colon, transverse colon, descending colon, sigmoid colon and      rectum for evaluation of microscopic colitis.      The terminal ileum appeared normal.      A stool aspirate was sent for GI pathogen panel, Clostridium difficile,      and calprotectin.                                                                                Impression:        - Stool aspirate sent.                    - Internal hemorrhoids.                    - Anal papilla(e) were hypertrophied.                    - Diverticulosis in the sigmoid colon.                    - The examined portion of the ileum was normal.                    - Biopsies were taken with a cold forceps from the cecum,                    ascending colon, transverse colon, descending colon,                    sigmoid colon and rectum for evaluation of microscopic                    colitis. Recommendation:    - Patient has a contact number available for emergencies.                    The signs and symptoms of potential delayed complications                    were discussed with the patient. Return to normal                    activities tomorrow. Written discharge instructions were                    provided to the patient.                    - Resume previous diet.                    - Continue present  medications.                    - Repeat colonoscopy in 10 years for screening purposes  pending health status at that time.                                                                                Procedure Code(s): --- Professional ---                    209 [id number], 52, Colonoscopy, flexible; with biopsy, single or                    multiple Diagnosis Code(s): --- Professional ---                    K64.8, Other hemorrhoids                    K52.9, Noninfective gastroenteritis and colitis,                    unspecified                    R19.7, Diarrhea, unspecified                    K92.1, Melena (includes Hematochezia)                    K57.30, Diverticulosis of large intestine without                    perforation or abscess without bleeding  CPT copyright 2019 [employer]. All rights reserved.  The codes documented in this report are preliminary and upon coder review may be revised to meet current compliance requirements.  Electronically Signed By Lorrene Rase, MD ______________________ LORRENE GEORGI RASE, MD 04/12/2019 9:08:37 AM The attending physician was present throughout the entire procedural suite including insertion, viewing, and removal. Number of Addenda: 0  Note Initiated On: 04/11/2019 11:30 AM  Results for orders placed or performed in visit on 06/17/24  Comprehensive metabolic panel with GFR   Collection Time: 06/17/24  8:33 AM  Result Value Ref Range   Glucose, Bld 93 65 - 99 mg/dL   BUN 11 7 - 25 mg/dL   Creat 9.34 9.39 - 8.99 mg/dL   eGFR 93 > OR = 60 fO/fpw/8.26f7   BUN/Creatinine Ratio SEE NOTE: 6 - 22 (calc)   Sodium 137 135 - 146 mmol/L   Potassium 4.3 3.5 - 5.3 mmol/L   Chloride 99 98 - 110 mmol/L   CO2 30 20 - 32 mmol/L   Calcium 9.7 8.6 - 10.4 mg/dL   Total Protein 6.7 6.1 - 8.1 g/dL   Albumin 4.6 3.6 - 5.1 g/dL   Globulin 2.1 1.9 - 3.7 g/dL (calc)   AG Ratio 2.2 1.0 - 2.5 (calc)   Total Bilirubin 0.4  0.2 - 1.2 mg/dL   Alkaline phosphatase (APISO) 40 37 - 153 U/L   AST 16 10 - 35 U/L   ALT 18 6 - 29 U/L  CBC with Differential/Platelet   Collection Time: 06/17/24  8:33 AM  Result Value Ref Range   WBC 3.9 3.8 - 10.8 Thousand/uL   RBC 3.95 3.80 - 5.10 Million/uL   Hemoglobin 11.8 11.7 - 15.5 g/dL  HCT 35.2 35.0 - 45.0 %   MCV 89.1 80.0 - 100.0 fL   MCH 29.9 27.0 - 33.0 pg   MCHC 33.5 32.0 - 36.0 g/dL   RDW 87.9 88.9 - 84.9 %   Platelets 216 140 - 400 Thousand/uL   MPV 10.1 7.5 - 12.5 fL   Neutro Abs 2,672 1,500 - 7,800 cells/uL   Absolute Lymphocytes 772 (L) 850 - 3,900 cells/uL   Absolute Monocytes 289 200 - 950 cells/uL   Eosinophils Absolute 129 15 - 500 cells/uL   Basophils Absolute 39 0 - 200 cells/uL   Neutrophils Relative % 68.5 %   Total Lymphocyte 19.8 %   Monocytes Relative 7.4 %   Eosinophils Relative 3.3 %   Basophils Relative 1.0 %  Hemoglobin A1c   Collection Time: 06/17/24  8:33 AM  Result Value Ref Range   Hgb A1c MFr Bld 6.7 (H) <5.7 %   Mean Plasma Glucose 146 mg/dL   eAG (mmol/L) 8.1 mmol/L  Lipid panel   Collection Time: 06/17/24  8:33 AM  Result Value Ref Range   Cholesterol 118 <200 mg/dL   HDL 48 (L) > OR = 50 mg/dL   Triglycerides 68 <849 mg/dL   LDL Cholesterol (Calc) 56 mg/dL (calc)   Total CHOL/HDL Ratio 2.5 <5.0 (calc)   Non-HDL Cholesterol (Calc) 70 <869 mg/dL (calc)  TSH   Collection Time: 06/17/24  8:33 AM  Result Value Ref Range   TSH 1.27 0.40 - 4.50 mIU/L      Assessment & Plan:   Problem List Items Addressed This Visit     Diabetes mellitus type 2, insulin  dependent (HCC)   Relevant Medications   losartan (COZAAR) 25 MG tablet   Other Relevant Orders   CT CARDIAC SCORING (SELF PAY ONLY)   Essential hypertension   Relevant Medications   losartan (COZAAR) 25 MG tablet   Other Relevant Orders   CT CARDIAC SCORING (SELF PAY ONLY)   Major depressive disorder, recurrent, in full remission (HCC)   OSA (obstructive sleep  apnea)   Other Visit Diagnoses       Annual physical exam    -  Primary     Hyperlipidemia associated with type 2 diabetes mellitus (HCC)       Relevant Medications   losartan (COZAAR) 25 MG tablet   Other Relevant Orders   CT CARDIAC SCORING (SELF PAY ONLY)     Episodic migraine       Relevant Medications   losartan (COZAAR) 25 MG tablet     Colon cancer screening       Relevant Orders   Ambulatory referral to Gastroenterology     Long-term current use of injectable noninsulin antidiabetic medication            Updated Health Maintenance information Reviewed recent lab results with patient Encouraged improvement to lifestyle with diet and exercise Goal of weight loss   Type 2 Diabetes Mellitus A1c increased to 6.7. Discussed risks of tight glycemic control and hypoglycemia. Decision to maintain current management without adding back sulfonylurea. We discussed goal at her age is acceptable < 8.0, 7-8 range is within target. She is < 7 so this is optimal. She has done well on Sulfonylurea in past but we still have risk of hypoglycemia, expressed my recommendation to avoid this medication. - Continue Mounjaro  10 mg. No dose adjustment needed - Recheck A1c in 3 months.  Hypertension Blood pressure occasionally elevated. Cardiologist added losartan. Discussed coordination for management. -  Continue losartan and hydrochlorothiazide . - Coordinate with cardiologist for blood pressure management.  Hyperlipidemia LDL at 56 mg/dL, meeting goal for high-risk patients. Discussed potential medication adjustment with cardiologist. - Continue rosuvastatin 20 mg. - Monitor lipid levels.  Tachycardia, unspecified History of elevated heart rate. Metoprolol discontinued due to bradycardia. Discussed potential inappropriate sinus tachycardia and need for cardiology input. She is waiting on reply. - Discuss with cardiology team regarding management options for tachycardia.  Anemia  history Iron supplementation (paused) Iron supplementation paused. Hemoglobin stable. Discussed potential for iron deficiency anemia. - Pause iron supplementation. - Check iron panel in 3 months. Determine if necessary  Temperature sensitivity (cold intolerance) Cold intolerance noted. Thyroid function normal. Discussed potential causes including iron deficiency anemia. - Evaluate iron status with future lab work.  Adult Wellness Visit Annual wellness visit conducted. Discussed health maintenance, vaccinations, and screenings. Reviewed lab results and medication list. - Administer flu shot when available in September or October. - Request diabetic eye exam records from Dr. Lavona office. - Refer to Dr[facility] Lorrene R[facility] at [facility] for colonoscopy screening. - Recommend Shingrix vaccine, two doses, at pharmacy.         Orders Placed This Encounter  Procedures   CT CARDIAC SCORING (SELF PAY ONLY)    Standing Status:   Future    Expiration Date:   06/24/2025    Preferred imaging location?:   [facility]   Ambulatory referral to Gastroenterology    Referral Priority:   Routine    Referral Type:   Consultation    Referral Reason:   Specialty Services Required    Number of Visits Requested:   1    No orders of the defined types were placed in this encounter.    Follow up plan: Return in about 3 months (around 09/24/2024) for 3 month fasting lab then 1 week later Follow-up DM, Anemia/Iron results.  A1c CBC Iron panel  Marsa Officer, DO [facility] 06/24/2024, 2:26 PM

## 2024-06-25 ENCOUNTER — Encounter: Payer: Self-pay | Admitting: Family Medicine

## 2024-06-25 DIAGNOSIS — G43909 Migraine, unspecified, not intractable, without status migrainosus: Secondary | ICD-10-CM

## 2024-06-25 DIAGNOSIS — E119 Type 2 diabetes mellitus without complications: Secondary | ICD-10-CM

## 2024-06-25 DIAGNOSIS — J301 Allergic rhinitis due to pollen: Secondary | ICD-10-CM

## 2024-06-25 MED ORDER — CETIRIZINE HCL 10 MG PO TABS: 10.0000 mg | ORAL_TABLET | Freq: Every day | ORAL | 3 refills | Status: AC

## 2024-06-25 MED ORDER — ACCU-CHEK SOFTCLIX LANCETS MISC: 12 refills | Status: DC

## 2024-06-25 MED ORDER — INSULIN PEN NEEDLE 32G X 4 MM MISC: 3 refills | Status: AC

## 2024-06-25 MED ORDER — RIZATRIPTAN BENZOATE 10 MG PO TBDP: 10.0000 mg | ORAL_TABLET | ORAL | 2 refills | Status: AC | PRN

## 2024-06-26 ENCOUNTER — Ambulatory Visit
Admission: RE | Admit: 2024-06-26 | Discharge: 2024-06-26 | Disposition: A | Discharge status: Home / Self Care | Payer: Self-pay | Source: Ambulatory Visit | Attending: Family Medicine | Admitting: Family Medicine

## 2024-06-26 DIAGNOSIS — E119 Type 2 diabetes mellitus without complications: Secondary | ICD-10-CM | POA: Insufficient documentation

## 2024-06-26 DIAGNOSIS — I10 Essential (primary) hypertension: Secondary | ICD-10-CM | POA: Insufficient documentation

## 2024-06-26 DIAGNOSIS — E1169 Type 2 diabetes mellitus with other specified complication: Secondary | ICD-10-CM | POA: Insufficient documentation

## 2024-06-26 DIAGNOSIS — G4733 Obstructive sleep apnea (adult) (pediatric): Secondary | ICD-10-CM | POA: Diagnosis not present

## 2024-06-26 DIAGNOSIS — Z794 Long term (current) use of insulin: Secondary | ICD-10-CM | POA: Insufficient documentation

## 2024-06-26 DIAGNOSIS — E785 Hyperlipidemia, unspecified: Secondary | ICD-10-CM | POA: Insufficient documentation

## 2024-06-27 ENCOUNTER — Ambulatory Visit: Payer: Self-pay | Admitting: Family Medicine

## 2024-07-15 DIAGNOSIS — E119 Type 2 diabetes mellitus without complications: Secondary | ICD-10-CM | POA: Diagnosis not present

## 2024-07-15 DIAGNOSIS — R0789 Other chest pain: Secondary | ICD-10-CM | POA: Diagnosis not present

## 2024-07-15 DIAGNOSIS — E785 Hyperlipidemia, unspecified: Secondary | ICD-10-CM | POA: Diagnosis not present

## 2024-07-15 DIAGNOSIS — R002 Palpitations: Secondary | ICD-10-CM | POA: Diagnosis not present

## 2024-07-15 DIAGNOSIS — I2584 Coronary atherosclerosis due to calcified coronary lesion: Secondary | ICD-10-CM | POA: Diagnosis not present

## 2024-07-15 DIAGNOSIS — I2510 Atherosclerotic heart disease of native coronary artery without angina pectoris: Secondary | ICD-10-CM | POA: Diagnosis not present

## 2024-07-15 DIAGNOSIS — E669 Obesity, unspecified: Secondary | ICD-10-CM | POA: Diagnosis not present

## 2024-07-15 DIAGNOSIS — I10 Essential (primary) hypertension: Secondary | ICD-10-CM | POA: Diagnosis not present

## 2024-07-26 DIAGNOSIS — G4733 Obstructive sleep apnea (adult) (pediatric): Secondary | ICD-10-CM | POA: Diagnosis not present

## 2024-08-02 ENCOUNTER — Ambulatory Visit: Payer: Self-pay

## 2024-08-02 NOTE — Telephone Encounter (Signed)
 FYI Only or Action Required?: FYI only for provider.  Patient was last seen in primary care on 06/24/2024 by Edman Marsa PARAS, DO.  Called Nurse Triage reporting Skin Problem.  Symptoms began several weeks ago.  Interventions attempted: Nothing.  Symptoms are: unchanged.  Triage Disposition: See PCP When Office is Open (Within 3 Days)  Patient/caregiver understands and will follow disposition?: Yes Reason for Disposition  Nursing judgment or information in reference  Answer Assessment - Initial Assessment Questions Patient reports 3-4 week hx of skin sensitivity.   1. REASON FOR CALL: What is your main concern right now?     Skin is sensitive to the touch on hands, arms and back  2. ONSET:      3-4 weeks ago  3. SEVERITY:      Sensitivity worsens when wearing/rubbing on clothing.  Protocols used: No Guideline Available-A-AH Copied from CRM [id number]. Topic: Clinical - Red Word Triage >> Aug 02, 2024 11:02 AM Avram MATSU wrote: Red Word that prompted transfer to Nurse Triage: skin hurts on hands and arms

## 2024-08-05 ENCOUNTER — Encounter: Payer: Self-pay | Admitting: Family Medicine

## 2024-08-05 ENCOUNTER — Ambulatory Visit (INDEPENDENT_AMBULATORY_CARE_PROVIDER_SITE_OTHER): Admitting: Family Medicine

## 2024-08-05 VITALS — BP 134/80 | HR 76 | Ht 68.0 in | Wt 200.2 lb

## 2024-08-05 DIAGNOSIS — E559 Vitamin D deficiency, unspecified: Secondary | ICD-10-CM

## 2024-08-05 DIAGNOSIS — I872 Venous insufficiency (chronic) (peripheral): Secondary | ICD-10-CM

## 2024-08-05 DIAGNOSIS — Z794 Long term (current) use of insulin: Secondary | ICD-10-CM | POA: Diagnosis not present

## 2024-08-05 DIAGNOSIS — I83813 Varicose veins of bilateral lower extremities with pain: Secondary | ICD-10-CM

## 2024-08-05 DIAGNOSIS — E538 Deficiency of other specified B group vitamins: Secondary | ICD-10-CM

## 2024-08-05 DIAGNOSIS — Z23 Encounter for immunization: Secondary | ICD-10-CM

## 2024-08-05 DIAGNOSIS — M797 Fibromyalgia: Secondary | ICD-10-CM | POA: Diagnosis not present

## 2024-08-05 DIAGNOSIS — D509 Iron deficiency anemia, unspecified: Secondary | ICD-10-CM | POA: Diagnosis not present

## 2024-08-05 DIAGNOSIS — Z7985 Long-term (current) use of injectable non-insulin antidiabetic drugs: Secondary | ICD-10-CM

## 2024-08-05 DIAGNOSIS — E119 Type 2 diabetes mellitus without complications: Secondary | ICD-10-CM

## 2024-08-05 MED ORDER — SHINGRIX 50 MCG/0.5ML IM SUSR: INTRAMUSCULAR | 1 refills | Status: DC

## 2024-08-05 NOTE — Patient Instructions (Addendum)
 Thank you for coming to the office today.  Okay to double Nortripyline 10mg  x 2 = 20mg  per day together or split dosing if needed  Use RICE therapy: - R - Rest / relative rest with activity modification avoid overuse of joint - I - Ice packs (make sure you use a towel or sock / something to protect skin) - C - Compression with socks / stockings - E - Elevation - if significant swelling, lift leg above heart level (toes above your nose) to help reduce swelling, most helpful at night after day of being on your feet  Consider referral to Vascular for consultation on varicose veins and swelling if needed  Flu Shot  Please schedule a Follow-up Appointment to: Return if symptoms worsen or fail to improve.  If you have any other questions or concerns, please feel free to call the office or send a message through MyChart. You may also schedule an earlier appointment if necessary.  Additionally, you may be receiving a survey about your experience at our office within a few days to 1 week by e-mail or mail. We value your feedback.  Marsa Officer, DO [facility], [place]

## 2024-08-05 NOTE — Progress Notes (Signed)
 Subjective:    Patient ID: Brittany Cervantes, female    DOB: [birth year], 73 y.o.   MRN: [id number]  Brittany Cervantes is a 73 y.o. female presenting on 08/05/2024 for skin concerns (Skin sensitivity x 3 weeks, worsening. Rx for Shingles vaccine. )   HPI  Discussed the use of AI scribe software for clinical note transcription with the patient, who gave verbal consent to proceed.  History of Present Illness   Brittany Cervantes is a 73 year old female who presents with skin sensitivity and inquiries about a shingles vaccination.  Cutaneous sensitivity and nail changes - Skin sensitivity for the past couple of weeks, exacerbated by light pressure such as when her dog stands on her - Prominent veins, particularly on the legs - Peeling and cracking nails - Uncertain if symptoms are related to prior iron supplementation, which was discontinued after the last visit  Fatigue and cognitive symptoms - Fatigue and difficulty concentrating - Physically and mentally exhausted from caring for her nearly three-year-old grandchild  Restless legs syndrome - Currently taking nortriptyline at night for restless leg symptoms Asking about 2nd dose if need  Iron supplementation discontinuation - Previously on iron supplements, discontinued after last visit - Scheduled for follow-up iron level check in November  Immunization inquiry - Interested in receiving a shingles vaccination Needs order  Fibromyalgia - History of fibromyalgia, suspects it may be contributing to current symptoms        Health Maintenance: Flu Shot today     08/05/2024   10:27 AM 06/24/2024    2:10 PM 04/15/2024    1:19 PM  Depression screen PHQ 2/9  Decreased Interest 0 0 0  Down, Depressed, Hopeless 0 0 0  PHQ - 2 Score 0 0 0  Altered sleeping 0 1 0  Tired, decreased energy 1 0 1  Change in appetite 0 0 0  Feeling bad or failure about yourself  0 0 0  Trouble concentrating 1 0 0  Moving slowly or fidgety/restless 0 0 0   Suicidal thoughts 0 0 0  PHQ-9 Score 2 1 1   Difficult doing work/chores Not difficult at all Not difficult at all        08/05/2024   10:28 AM 06/24/2024    2:10 PM 04/15/2024    1:20 PM 03/18/2024   10:42 AM  GAD 7 : Generalized Anxiety Score  Nervous, Anxious, on Edge 2 1 0 0  Control/stop worrying 0 0 0 0  Worry too much - different things 0 0 0 0  Trouble relaxing 0 0 0 0  Restless 0 0 0 0  Easily annoyed or irritable 1 0 0 0  Afraid - awful might happen 0 0 0 0  Total GAD 7 Score 3 1 0 0  Anxiety Difficulty Not difficult at all Not difficult at all      Social History   Tobacco Use   Smoking status: Former    Types: Cigarettes   Smokeless tobacco: Never  Vaping Use   Vaping status: Never Used  Substance Use Topics   Alcohol  use: Yes   Drug use: Never    Review of Systems Per HPI unless specifically indicated above     Objective:    BP 134/80 (BP Location: Left Arm, Patient Position: Sitting, Cuff Size: Large)   Pulse 76   Ht 5' 8 (1.727 m)   Wt 200 lb 3.2 oz (90.8 kg)   SpO2 96%   BMI 30.44 kg/m   Wt Readings  from Last 3 Encounters:  08/05/24 200 lb 3.2 oz (90.8 kg)  06/24/24 200 lb (90.7 kg)  04/29/24 207 lb (93.9 kg)    Physical Exam Vitals and nursing note reviewed.  Constitutional:      General: She is not in acute distress.    Appearance: Normal appearance. She is well-developed. She is not diaphoretic.     Comments: Well-appearing, comfortable, cooperative  HENT:     Head: Normocephalic and atraumatic.  Eyes:     General:        Right eye: No discharge.        Left eye: No discharge.     Conjunctiva/sclera: Conjunctivae normal.  Cardiovascular:     Rate and Rhythm: Normal rate.  Pulmonary:     Effort: Pulmonary effort is normal.  Musculoskeletal:     Right lower leg: Edema (trace only) present.     Left lower leg: Edema present.     Comments: Varicose veins and spider veins lower extremities symmetrical, no ulceration  Skin:     General: Skin is warm and dry.     Findings: No erythema or rash.  Neurological:     Mental Status: She is alert and oriented to person, place, and time.  Psychiatric:        Mood and Affect: Mood normal.        Behavior: Behavior normal.        Thought Content: Thought content normal.     Comments: Well groomed, good eye contact, normal speech and thoughts     Results for orders placed or performed in visit on 07/15/24  HM DIABETES EYE EXAM   Collection Time: 03/26/24  9:29 AM  Result Value Ref Range   HM Diabetic Eye Exam No Retinopathy No Retinopathy      Assessment & Plan:   Problem List Items Addressed This Visit     Diabetes mellitus type 2, insulin  dependent (HCC)   Relevant Orders   Hemoglobin A1c   Fibromyalgia   Stasis dermatitis of both legs - Primary   Other Visit Diagnoses       Influenza vaccine administered       Relevant Orders   Flu vaccine HIGH DOSE PF(Fluzone Trivalent) (Completed)     Need for shingles vaccine       Relevant Medications   SHINGRIX injection     Vitamin B12 deficiency       Relevant Orders   Vitamin B12     Vitamin D deficiency       Relevant Orders   VITAMIN D 25 Hydroxy (Vit-D Deficiency, Fractures)     Iron deficiency anemia, unspecified iron deficiency anemia type       Relevant Orders   Iron, TIBC and Ferritin Panel   CBC with Differential/Platelet     Varicose veins of both lower extremities with pain         Long-term current use of injectable noninsulin antidiabetic medication            Varicose veins of lower extremities with associated skin sensitivity Venous Stasis dermatitis / insufficiency Chronic varicose veins with skin sensitivity due to distended veins and age-related skin changes. - Recommend elevation and compression for circulation improvement. - Consider vascular specialist referral if symptoms worsen. She declines today  Restless legs syndrome Neuropathy / sensitivie vs Fibromyalgia Restless legs  syndrome managed with nortriptyline; current dosage may be insufficient. - Adjust nortriptyline to 10 mg twice daily as needed. This is prescribed by Neurology,  she can adjust dose or request new order  Fibromyalgia Chronic fibromyalgia contributing to generalized sensitivity and discomfort. See above, treat with TCA  Iron deficiency (monitoring off iron therapy) Iron deficiency previously managed with supplementation, currently off therapy. - Monitor iron levels on November 10th. - Consider checking B12 and other nutritional deficiencies next visit.  Type 2 Diabetes Due for A1c at next visit On therapy with Mounjaro   General Health Maintenance Discussed shingles vaccine and flu shot. Prescription for shingles vaccine sent to pharmacy. - Administer flu shot.        Future consider Vascular for venous stasis  Orders Placed This Encounter  Procedures   Flu vaccine HIGH DOSE PF(Fluzone Trivalent)   Iron, TIBC and Ferritin Panel    Standing Status:   Future    Expected Date:   09/16/2024    Expiration Date:   12/15/2024   Hemoglobin A1c    Standing Status:   Future    Expected Date:   09/16/2024    Expiration Date:   12/15/2024   CBC with Differential/Platelet    Standing Status:   Future    Expected Date:   09/16/2024    Expiration Date:   12/15/2024   Vitamin B12    Standing Status:   Future    Expected Date:   09/16/2024    Expiration Date:   12/15/2024   VITAMIN D 25 Hydroxy (Vit-D Deficiency, Fractures)    Standing Status:   Future    Expected Date:   09/16/2024    Expiration Date:   12/15/2024    Meds ordered this encounter  Medications   SHINGRIX injection    Sig: Inject 0.5 mL into muscle for shingles vaccine. Repeat dose in 2-6 months.    Dispense:  0.5 mL    Refill:  1    Follow up plan: Return if symptoms worsen or fail to improve.  Future labs ordered for 09/16/24 add orders for 09/16/24 Vitamin B12, Vitamin D anemia panel     Marsa Officer,  DO [facility] 08/05/2024, 10:50 AM

## 2024-08-06 ENCOUNTER — Other Ambulatory Visit: Payer: Self-pay | Admitting: Family Medicine

## 2024-08-06 DIAGNOSIS — E119 Type 2 diabetes mellitus without complications: Secondary | ICD-10-CM

## 2024-08-08 NOTE — Telephone Encounter (Signed)
 Requested medication (s) are due for refill today: yes  Requested medication (s) are on the active medication list: yes  Last refill:  05/16/24 2 ml 2 RF  Future visit scheduled: yes  Notes to clinic:  med not assigned to a protocol   Requested Prescriptions  Pending Prescriptions Disp Refills   MOUNJARO  10 MG/0.5ML Pen [Pharmacy Med Name: MOUNJARO  10 MG/0.5ML SUBQ SOLN ML] 2 mL 2    Sig: INJECT 10MG  SUBCUTANEOUSLY ONCE A WEEK     Off-Protocol Failed - 08/08/2024  8:01 AM      Failed - Medication not assigned to a protocol, review manually.      Passed - Valid encounter within last 12 months    Recent Outpatient Visits           3 days ago Stasis dermatitis of both legs   [place] [facility] [place], Marsa PARAS, DO   1 month ago Annual physical exam   [place] [facility] [place] Marsa PARAS, DO   3 months ago Acute non-recurrent frontal sinusitis   [place] [facility] [place], Marsa PARAS, DO   4 months ago Diabetes mellitus type 2, insulin  dependent [facility])   [place] [facility] [place], Marsa PARAS, DO   7 months ago Diabetes mellitus type 2, insulin  dependent [facility])   [place] [facility] [place], Marsa PARAS, [place]

## 2024-08-10 ENCOUNTER — Other Ambulatory Visit: Payer: Self-pay | Admitting: Family Medicine

## 2024-08-10 DIAGNOSIS — F3342 Major depressive disorder, recurrent, in full remission: Secondary | ICD-10-CM

## 2024-08-10 DIAGNOSIS — I10 Essential (primary) hypertension: Secondary | ICD-10-CM

## 2024-08-10 DIAGNOSIS — E119 Type 2 diabetes mellitus without complications: Secondary | ICD-10-CM

## 2024-08-12 MED ORDER — MOUNJARO 10 MG/0.5ML ~~LOC~~ SOAJ: 10.0000 mg | SUBCUTANEOUS | 2 refills | Status: DC

## 2024-08-12 NOTE — Addendum Note (Signed)
 Addended by: ZELIA GAUZE D on: 08/12/2024 08:26 AM   Modules accepted: Orders

## 2024-08-12 NOTE — Telephone Encounter (Signed)
 Requested Prescriptions  Pending Prescriptions Disp Refills   TRESIBA  FLEXTOUCH 100 UNIT/ML FlexTouch Pen [Pharmacy Med Name: TRESIBA  FLEXTOUCH 100 UNIT/ML SUBQ] 45 mL 0    Sig: INJECT 50 UNITS SUBCUTANEOUSLY AT BEDTIME     Endocrinology:  Diabetes - Insulins Passed - 08/12/2024  4:26 PM      Passed - HBA1C is between 0 and 7.9 and within 180 days    Hemoglobin A1C  Date Value Ref Range Status  11/10/2023 6.8  Final   Hgb A1c MFr Bld  Date Value Ref Range Status  06/17/2024 6.7 (H) <5.7 % Final    Comment:    For someone without known diabetes, a hemoglobin A1c value of 6.5% or greater indicates that they may have  diabetes and this should be confirmed with a follow-up  test. . For someone with known diabetes, a value <7% indicates  that their diabetes is well controlled and a value  greater than or equal to 7% indicates suboptimal  control. A1c targets should be individualized based on  duration of diabetes, age, comorbid conditions, and  other considerations. . Currently, no consensus exists regarding use of hemoglobin A1c for diagnosis of diabetes for children. SABRA Amy - Valid encounter within last 6 months    Recent Outpatient Visits           1 week ago Stasis dermatitis of both legs   [place] [facility] [place], Marsa PARAS, DO   1 month ago Annual physical exam   [place] [facility] [place], Marsa PARAS, DO   3 months ago Acute non-recurrent frontal sinusitis   [place] [facility] [place], Marsa PARAS, DO   4 months ago Diabetes mellitus type 2, insulin  dependent [facility])   [place] [facility] [place], Marsa PARAS, DO   7 months ago Diabetes mellitus type 2, insulin  dependent [facility])   [place] [facility], Marsa PARAS, DO               amLODipine  (NORVASC ) 5 MG tablet [Pharmacy Med Name: AMLODIPINE  BESYLATE 5 MG TAB] 90  tablet 1    Sig: TAKE 1 TABLET BY MOUTH ONCE DAILY     Cardiovascular: Calcium Channel Blockers 2 Passed - 08/12/2024  4:26 PM      Passed - Last BP in normal range    BP Readings from Last 1 Encounters:  08/05/24 134/80         Passed - Last Heart Rate in normal range    Pulse Readings from Last 1 Encounters:  08/05/24 76         Passed - Valid encounter within last 6 months    Recent Outpatient Visits           1 week ago Stasis dermatitis of both legs   [place] [facility] [place], Marsa PARAS, DO   1 month ago Annual physical exam   [place] [place] Marsa PARAS, DO   3 months ago Acute non-recurrent frontal sinusitis   [place] [facility] [place], Marsa PARAS, DO   4 months ago Diabetes mellitus type 2, insulin  dependent [facility])   [place] [facility] [place], Marsa PARAS, DO   7 months ago Diabetes mellitus type 2, insulin  dependent [facility])   [place] [facility] [place], Marsa PARAS, [place]  hydrochlorothiazide  (HYDRODIURIL ) 12.5 MG tablet [Pharmacy Med Name: HYDROCHLOROTHIAZIDE  12.5 MG TAB] 90 tablet 0    Sig: TAKE 1 TABLET BY MOUTH ONCE DAILY     Cardiovascular: Diuretics - Thiazide Passed - 08/12/2024  4:26 PM      Passed - Cr in normal range and within 180 days    Creat  Date Value Ref Range Status  06/17/2024 0.65 0.60 - 1.00 mg/dL Final   Creatinine, Urine  Date Value Ref Range Status  03/18/2024 123 20 - 275 mg/dL Final         Passed - K in normal range and within 180 days    Potassium  Date Value Ref Range Status  06/17/2024 4.3 3.5 - 5.3 mmol/L Final         Passed - Na in normal range and within 180 days    Sodium  Date Value Ref Range Status  06/17/2024 137 135 - 146 mmol/L Final         Passed - Last BP in normal range    BP Readings from Last 1 Encounters:  08/05/24 134/80         Passed - Valid  encounter within last 6 months    Recent Outpatient Visits           1 week ago Stasis dermatitis of both legs   [place] [facility] [place] [facility]a PARAS, DO   1 month ago Annual physical exam   [facility] [place], Marsa PARAS, DO   3 months ago Acute non-recurrent frontal sinusitis   [place] [facility] [place], Marsa PARAS, DO   4 months ago Diabetes mellitus type 2, insulin  dependent [facility])   [place] [facility] [place], Marsa PARAS, DO   7 months ago Diabetes mellitus type 2, insulin  dependent [facility])   [place] [facility] [place], Marsa PARAS, DO               citalopram  (CELEXA ) 10 MG tablet [Pharmacy Med Name: CITALOPRAM  HYDROBROMIDE 10 MG TAB] 90 tablet 0    Sig: TAKE 1 TABLET BY MOUTH ONCE DAILY     Psychiatry:  Antidepressants - SSRI Passed - 08/12/2024  4:26 PM      Passed - Completed PHQ-2 or PHQ-9 in the last 360 days      Passed - Valid encounter within last 6 months    Recent Outpatient Visits           1 week ago Stasis dermatitis of both legs   [place] [facility] [place], Marsa PARAS, DO   1 month ago Annual physical exam   [place] [facility] [place], Marsa PARAS, DO   3 months ago Acute non-recurrent frontal sinusitis   [place] [facility] [place], Marsa PARAS, DO   4 months ago Diabetes mellitus type 2, insulin  dependent [facility])   [place] [facility] [place], Marsa PARAS, DO   7 months ago Diabetes mellitus type 2, insulin  dependent [facility])   [place] [facility] [place], Marsa PARAS, [place]

## 2024-09-03 ENCOUNTER — Encounter: Payer: Self-pay | Admitting: Family Medicine

## 2024-09-10 ENCOUNTER — Other Ambulatory Visit: Payer: Self-pay | Admitting: Family Medicine

## 2024-09-10 ENCOUNTER — Telehealth: Payer: Self-pay | Admitting: Family Medicine

## 2024-09-10 NOTE — Telephone Encounter (Signed)
 Routing phone note to [facility] for assistance.  This patient requested that her Referral to GI be switched to [facility]. She was referred to [facility] back in August 2025. She has not heard back from them and said it has been difficult to communicate with [facility].  She is requesting that the referral be switched to [facility] locally. The reason for referral is for routine screening Colonoscopy.  Please let me know if you need a new referral order or if can switch the existing referral.  Thank you!  Marsa Officer, DO [facility] 09/10/2024, 6:32 PM

## 2024-09-12 ENCOUNTER — Other Ambulatory Visit: Payer: Self-pay | Admitting: Family Medicine

## 2024-09-12 ENCOUNTER — Telehealth: Payer: Self-pay

## 2024-09-12 DIAGNOSIS — Z1211 Encounter for screening for malignant neoplasm of colon: Secondary | ICD-10-CM

## 2024-09-12 NOTE — Telephone Encounter (Signed)
 Received a letter patient would like to have her colonoscopy with Dr Cloria at [facility]. Letter scanned in

## 2024-09-12 NOTE — Telephone Encounter (Signed)
 The referral was switched from [facility] to [facility] on 09/11/24. Now it looks like [facility] is declining the referral because the order said [facility] so they seem to be confused. I will work with [facility] to figure out how to correct the confusion with [facility] to get patient scheduled there.   Marsa Officer, DO [facility] 09/12/2024, 1:38 PM

## 2024-09-16 ENCOUNTER — Other Ambulatory Visit

## 2024-09-16 DIAGNOSIS — E538 Deficiency of other specified B group vitamins: Secondary | ICD-10-CM | POA: Diagnosis not present

## 2024-09-16 DIAGNOSIS — E119 Type 2 diabetes mellitus without complications: Secondary | ICD-10-CM | POA: Diagnosis not present

## 2024-09-16 DIAGNOSIS — E559 Vitamin D deficiency, unspecified: Secondary | ICD-10-CM

## 2024-09-16 DIAGNOSIS — D509 Iron deficiency anemia, unspecified: Secondary | ICD-10-CM

## 2024-09-16 DIAGNOSIS — Z794 Long term (current) use of insulin: Secondary | ICD-10-CM | POA: Diagnosis not present

## 2024-09-17 LAB — CBC WITH DIFFERENTIAL/PLATELET
Absolute Lymphocytes: 874 {cells}/uL (ref 850–3900)
Absolute Monocytes: 339 {cells}/uL (ref 200–950)
Basophils Absolute: 39 {cells}/uL (ref 0–200)
Basophils Relative: 1 %
Eosinophils Absolute: 109 {cells}/uL (ref 15–500)
Eosinophils Relative: 2.8 %
HCT: 35.8 % (ref 35.0–45.0)
Hemoglobin: 12 g/dL (ref 11.7–15.5)
MCH: 29.9 pg (ref 27.0–33.0)
MCHC: 33.5 g/dL (ref 32.0–36.0)
MCV: 89.3 fL (ref 80.0–100.0)
MPV: 10.5 fL (ref 7.5–12.5)
Monocytes Relative: 8.7 %
Neutro Abs: 2539 {cells}/uL (ref 1500–7800)
Neutrophils Relative %: 65.1 %
Platelets: 244 Thousand/uL (ref 140–400)
RBC: 4.01 Million/uL (ref 3.80–5.10)
RDW: 12.2 % (ref 11.0–15.0)
Total Lymphocyte: 22.4 %
WBC: 3.9 Thousand/uL (ref 3.8–10.8)

## 2024-09-17 LAB — IRON,TIBC AND FERRITIN PANEL
%SAT: 17 % (ref 16–45)
Ferritin: 72 ng/mL (ref 16–288)
Iron: 57 ug/dL (ref 45–160)
TIBC: 333 ug/dL (ref 250–450)

## 2024-09-17 LAB — HEMOGLOBIN A1C
Hgb A1c MFr Bld: 6.3 % — ABNORMAL HIGH (ref <5.7)
Mean Plasma Glucose: 134 mg/dL
eAG (mmol/L): 7.4 mmol/L

## 2024-09-17 LAB — VITAMIN B12: Vitamin B-12: 305 pg/mL (ref 200–1100)

## 2024-09-17 LAB — VITAMIN D 25 HYDROXY (VIT D DEFICIENCY, FRACTURES): Vit D, 25-Hydroxy: 43 ng/mL (ref 30–100)

## 2024-09-18 DIAGNOSIS — L578 Other skin changes due to chronic exposure to nonionizing radiation: Secondary | ICD-10-CM | POA: Diagnosis not present

## 2024-09-18 DIAGNOSIS — I872 Venous insufficiency (chronic) (peripheral): Secondary | ICD-10-CM | POA: Diagnosis not present

## 2024-09-18 DIAGNOSIS — L570 Actinic keratosis: Secondary | ICD-10-CM | POA: Diagnosis not present

## 2024-09-18 DIAGNOSIS — D229 Melanocytic nevi, unspecified: Secondary | ICD-10-CM | POA: Diagnosis not present

## 2024-09-18 DIAGNOSIS — L820 Inflamed seborrheic keratosis: Secondary | ICD-10-CM | POA: Diagnosis not present

## 2024-09-18 DIAGNOSIS — L814 Other melanin hyperpigmentation: Secondary | ICD-10-CM | POA: Diagnosis not present

## 2024-09-18 DIAGNOSIS — L821 Other seborrheic keratosis: Secondary | ICD-10-CM | POA: Diagnosis not present

## 2024-09-18 DIAGNOSIS — L84 Corns and callosities: Secondary | ICD-10-CM | POA: Diagnosis not present

## 2024-09-23 ENCOUNTER — Ambulatory Visit: Admitting: Family Medicine

## 2024-09-23 ENCOUNTER — Encounter: Payer: Self-pay | Admitting: Family Medicine

## 2024-09-23 VITALS — BP 150/88 | HR 67 | Ht 68.0 in

## 2024-09-23 DIAGNOSIS — E538 Deficiency of other specified B group vitamins: Secondary | ICD-10-CM

## 2024-09-23 DIAGNOSIS — E119 Type 2 diabetes mellitus without complications: Secondary | ICD-10-CM | POA: Diagnosis not present

## 2024-09-23 DIAGNOSIS — Z7985 Long-term (current) use of injectable non-insulin antidiabetic drugs: Secondary | ICD-10-CM | POA: Diagnosis not present

## 2024-09-23 DIAGNOSIS — M810 Age-related osteoporosis without current pathological fracture: Secondary | ICD-10-CM | POA: Diagnosis not present

## 2024-09-23 DIAGNOSIS — I10 Essential (primary) hypertension: Secondary | ICD-10-CM | POA: Diagnosis not present

## 2024-09-23 DIAGNOSIS — Z794 Long term (current) use of insulin: Secondary | ICD-10-CM

## 2024-09-23 DIAGNOSIS — R1310 Dysphagia, unspecified: Secondary | ICD-10-CM | POA: Diagnosis not present

## 2024-09-23 MED ORDER — ONETOUCH VERIO VI STRP: ORAL_STRIP | 3 refills | Status: AC

## 2024-09-23 MED ORDER — ACCU-CHEK SOFTCLIX LANCETS MISC: 12 refills | Status: AC

## 2024-09-23 MED ORDER — LOSARTAN POTASSIUM 50 MG PO TABS: 50.0000 mg | ORAL_TABLET | Freq: Every day | ORAL | 1 refills | Status: DC

## 2024-09-23 NOTE — Progress Notes (Unsigned)
 Subjective:    Patient ID: Brittany Cervantes, female    DOB: [birth year], 73 y.o.   MRN: [id number]  Brittany Cervantes is a 73 y.o. female presenting on 09/23/2024 for Medical Management of Chronic Issues   HPI  Discussed the use of AI scribe software for clinical note transcription with the patient, who gave verbal consent to proceed.  History of Present Illness   Brittany Cervantes is a 73 year old female with diabetes who presents with difficulty swallowing and concerns about esophageal issues.  Dysphagia and sensation of airway obstruction - Difficulty swallowing, occurring frequently and particularly bothersome at night - Sensation of air being trapped in the throat, especially at night, causing a feeling of inability to breathe - Describes nocturnal episodes as 'very scary' - History of trouble swallowing with prior workup including swallow study and positional advice for drinking fluids - Prior CT scan of the neck in 2021 and endoscopy in June 2020 as part of previous evaluation - Concern about esophageal malignancy due to first husband's history of esophageal cancer diagnosed after similar symptoms - Previous management of GERD unsuccessful over the years  Type 2 Diabetes - Diabetes mellitus with recent hemoglobin A1c of 6.3, improved from previous value of 6.7  Fatigue and micronutrient status - Fatigue present - Iron levels at the lower end of normal but sufficient; not currently taking iron supplements - Vitamin B12 level of 305, considered mildly low; not currently taking vitamin B12 supplements  Osteoporosis and calcium intake - History of osteoporosis - Previously took a multivitamin but discontinued due to concerns about calcium intake affecting renal function  Hypertension - Elevated blood pressure during visit at 150/88 mmHg - Currently taking losartan 25 mg and hydrochlorothiazide  12.5 mg daily - Not regularly monitoring blood pressure at home          08/05/2024    10:27 AM 06/24/2024    2:10 PM 04/15/2024    1:19 PM  Depression screen PHQ 2/9  Decreased Interest 0 0 0  Down, Depressed, Hopeless 0 0 0  PHQ - 2 Score 0 0 0  Altered sleeping 0 1 0  Tired, decreased energy 1 0 1  Change in appetite 0 0 0  Feeling bad or failure about yourself  0 0 0  Trouble concentrating 1 0 0  Moving slowly or fidgety/restless 0 0 0  Suicidal thoughts 0 0 0  PHQ-9 Score 2  1  1    Difficult doing work/chores Not difficult at all Not difficult at all      Data saved with a previous flowsheet row definition       08/05/2024   10:28 AM 06/24/2024    2:10 PM 04/15/2024    1:20 PM 03/18/2024   10:42 AM  GAD 7 : Generalized Anxiety Score  Nervous, Anxious, on Edge 2 1 0 0  Control/stop worrying 0 0 0 0  Worry too much - different things 0 0 0 0  Trouble relaxing 0 0 0 0  Restless 0 0 0 0  Easily annoyed or irritable 1 0 0 0  Afraid - awful might happen 0 0 0 0  Total GAD 7 Score 3 1 0 0  Anxiety Difficulty Not difficult at all Not difficult at all      Social History   Tobacco Use   Smoking status: Former    Types: Cigarettes   Smokeless tobacco: Never  Vaping Use   Vaping status: Never Used  Substance Use Topics  Alcohol  use: Yes   Drug use: Never    Review of Systems Per HPI unless specifically indicated above     Objective:    BP (!) 150/88 (BP Location: Left Arm, Cuff Size: Normal)   Pulse 67   Ht 5' 8 (1.727 m)   SpO2 97%   BMI 30.44 kg/m   Wt Readings from Last 3 Encounters:  08/05/24 200 lb 3.2 oz (90.8 kg)  06/24/24 200 lb (90.7 kg)  04/29/24 207 lb (93.9 kg)    Physical Exam Vitals and nursing note reviewed.  Constitutional:      General: She is not in acute distress.    Appearance: Normal appearance. She is well-developed. She is not diaphoretic.     Comments: Well-appearing, comfortable, cooperative  HENT:     Head: Normocephalic and atraumatic.  Eyes:     General:        Right eye: No discharge.        Left eye: No  discharge.     Conjunctiva/sclera: Conjunctivae normal.  Cardiovascular:     Rate and Rhythm: Normal rate.  Pulmonary:     Effort: Pulmonary effort is normal.  Skin:    General: Skin is warm and dry.     Findings: No erythema or rash.  Neurological:     Mental Status: She is alert and oriented to person, place, and time.  Psychiatric:        Mood and Affect: Mood normal.        Behavior: Behavior normal.        Thought Content: Thought content normal.     Comments: Well groomed, good eye contact, normal speech and thoughts     I have personally reviewed the radiology report from 12/12/19 CT Neck  CLINICAL DATA:  Left-sided neck swelling   EXAM: CT NECK WITHOUT CONTRAST   TECHNIQUE: Multidetector CT imaging of the neck was performed following the standard protocol without intravenous contrast.   COMPARISON:  None.   FINDINGS: Pharynx and larynx: Prominent tonsils and adenoid bilaterally without focal mass. No abscess. Tonsilliths bilaterally. Epiglottis and vocal cords normal.   Salivary glands: No inflammation, mass, or stone.   Thyroid: Negative   Lymph nodes: No enlarged lymph nodes in the neck.   Vascular: Mild carotid artery calcification bilaterally. Limited vascular evaluation without intravenous contrast.   Limited intracranial: Negative   Visualized orbits: Not imaged   Mastoids and visualized paranasal sinuses: Negative   Skeleton: No acute skeletal abnormality. Mild degenerative change in the cervical spine.   Upper chest: Lung apices clear bilaterally.   Other: No significant soft tissue edema.   IMPRESSION: Hypertrophic tonsils and adenoid bilaterally likely due to hypertrophy. No focal mass or abscess.   No adenopathy in the neck.  No soft tissue swelling.     Electronically Signed   By: Carlin Gaskins M.D.   On: 12/12/2019 18:48  ------------------------  Upper  Endoscopy  Narrative  _______________________________________________________________________________ Patient Name: Brittany Cervantes         Procedure Date: 04/12/2019 7:29 AM MRN: [id number]                     Date of Birth: 1951/10/02 Admit Type: Outpatient                Age: 51 Room: [facility] GI PROCEDURES 02        Gender: Female Note Status: [employer]  Instrument Name: [id number] _______________________________________________________________________________  Procedure:         Upper GI endoscopy Indications:       Melena, Diarrhea Providers:         CRAIG GEORGI RASE, MD, INOCENTE DOROTHA GAL, RN, Loretta Rod, Technician Referring MD:      ELEANOR TEDDI SAUCER, MD (Referring MD) Medicines:         Propofol per Anesthesia Complications:     No immediate complications. _______________________________________________________________________________ Procedure:         Pre-Anesthesia Assessment:                    - Prior to the procedure, a History and Physical was                    performed, and patient medications and allergies were                    reviewed. The patient's tolerance of previous anesthesia                    was also reviewed. The risks and benefits of the procedure                    and the sedation options and risks were discussed with the                    patient. All questions were answered, and informed consent                    was obtained. Prior Anticoagulants: The patient has taken                    no previous anticoagulant or antiplatelet agents except                    for aspirin. ASA Grade Assessment: III - A patient with                    severe systemic disease. After reviewing the risks and                    benefits, the patient was deemed in satisfactory condition                    to undergo the procedure.                    After obtaining informed consent, the endoscope was passed                     under direct vision. Throughout the procedure, the                    patient's blood pressure, pulse, and oxygen saturations                    were monitored continuously. The was introduced through                    the mouth, and advanced to the second part of duodenum.                    The upper GI endoscopy was accomplished without  difficulty. The patient tolerated the procedure well.                                                                                Findings:      An erythematous nodule was found at the base of the arytenoids.      The examined esophagus was normal.      The entire examined stomach was normal. Biopsies were taken with a cold      forceps for Helicobacter pylori testing.      The examined duodenum was normal. Biopsies for histology were taken with      a cold forceps for evaluation of celiac disease.                                                                                Impression:        - An erythematous nodule was found at the base of the                    arytenoids.                    - Normal esophagus.                    - Normal stomach. Biopsied.                    - Normal examined duodenum. Biopsied. Recommendation:    - Patient has a contact number available for emergencies.                    The signs and symptoms of potential delayed complications                    were discussed with the patient. Return to normal                    activities tomorrow. Written discharge instructions were                    provided to the patient.                    - Continue present medications.                    - Resume previous diet.                                                                                Procedure Code(s): --- Professional ---  56760, Esophagogastroduodenoscopy, flexible, transoral;                    with biopsy, single or multiple Diagnosis Code(s): ---  Professional ---                    K92.1, Melena (includes Hematochezia)                    R19.7, Diarrhea, unspecified  CPT copyright 2019 American Medical Association. All rights reserved.  The codes documented in this report are preliminary and upon coder review may be revised to meet current compliance requirements.  Electronically Signed By Lorrene Rase, MD ______________________ LORRENE GEORGI RASE, MD 04/12/2019 9:03:37 AM The attending physician was present throughout the entire procedural suite including insertion, viewing, and removal. Number of Addenda: 0  Note Initiated On: 04/12/2019 7:29 AM Other Result Text  Interface, Rad Results In - 04/12/2019  9:05 AM EDT _______________________________________________________________________________ Patient Name: Brittany Cervantes         Procedure Date: 04/12/2019 7:29 AM MRN: [id number]                     Date of Birth: Aug 14, 1951 Admit Type: Outpatient                Age: 89 Room: [facility] GI PROCEDURES 02        Gender: Female Note Status: Finalized                Instrument Name: [id number] _______________________________________________________________________________  Procedure:         Upper GI endoscopy Indications:       Melena, Diarrhea Providers:         CRAIG GEORGI RASE, MD, INOCENTE DOROTHA GAL, RN, Loretta Rod, Technician Referring MD:      ELEANOR TEDDI SAUCER, MD (Referring MD) Medicines:         Propofol per Anesthesia Complications:     No immediate complications. _______________________________________________________________________________ Procedure:         Pre-Anesthesia Assessment:                    - Prior to the procedure, a History and Physical was                    performed, and patient medications and allergies were                    reviewed. The patient's tolerance of previous anesthesia                    was also reviewed. The risks and benefits of the  procedure                    and the sedation options and risks were discussed with the                    patient. All questions were answered, and informed consent                    was obtained. Prior Anticoagulants: The patient has taken                    no previous anticoagulant or antiplatelet agents except  for aspirin. ASA Grade Assessment: III - A patient with                    severe systemic disease. After reviewing the risks and                    benefits, the patient was deemed in satisfactory condition                    to undergo the procedure.                    After obtaining informed consent, the endoscope was passed                    under direct vision. Throughout the procedure, the                    patient's blood pressure, pulse, and oxygen saturations                    were monitored continuously. The was introduced through                    the mouth, and advanced to the second part of duodenum.                    The upper GI endoscopy was accomplished without                    difficulty. The patient tolerated the procedure well.  Findings:      An erythematous nodule was found at the base of the arytenoids.      The examined esophagus was normal.      The entire examined stomach was normal. Biopsies were taken with a cold      forceps for Helicobacter pylori testing.      The examined duodenum was normal. Biopsies for histology were taken with      a cold forceps for evaluation of celiac disease.  Impression:        - An erythematous nodule was found at the base of the                    arytenoids.                    - Normal esophagus.                    - Normal stomach. Biopsied.                    - Normal examined duodenum. Biopsied. Recommendation:    - Patient has a contact number available for emergencies.                    The signs and symptoms of potential delayed complications                    were discussed  with the patient. Return to normal                    activities tomorrow. Written discharge instructions were                    provided to the patient.                    -  Continue present medications.                    - Resume previous diet.  Procedure Code(s): --- Professional ---                    [phone], Esophagogastroduodenoscopy, flexible, transoral;                    with biopsy, single or multiple Diagnosis Code(s): --- Professional ---                    K92.1, Melena (includes Hematochezia)                    R19.7, Diarrhea, unspecified  CPT copyright 2019 American Medical Association. All rights reserved.  The codes documented in this report are preliminary and upon coder review may be revised to meet current compliance requirements.  Electronically Signed By Lorrene Rase, MD ______________________ LORRENE GEORGI RASE, MD 04/12/2019 9:03:37 AM The attending physician was present throughout the entire procedural suite including insertion, viewing, and removal. Number of Addenda: 0  Note Initiated On: 04/12/2019 7:29 AM Specimen Collected: -- Last Resulted: --  Date: 04/12/19   Received From: [facility]      Results for orders placed or performed in visit on 09/16/24  VITAMIN D 25 Hydroxy (Vit-D Deficiency, Fractures)   Collection Time: 09/16/24  9:52 AM  Result Value Ref Range   Vit D, 25-Hydroxy 43 30 - 100 ng/mL  Vitamin B12   Collection Time: 09/16/24  9:52 AM  Result Value Ref Range   Vitamin B-12 305 200 - 1,100 pg/mL  CBC with Differential/Platelet   Collection Time: 09/16/24  9:52 AM  Result Value Ref Range   WBC 3.9 3.8 - 10.8 Thousand/uL   RBC 4.01 3.80 - 5.10 Million/uL   Hemoglobin 12.0 11.7 - 15.5 g/dL   HCT 64.1 64.9 - 54.9 %   MCV 89.3 80.0 - 100.0 fL   MCH 29.9 27.0 - 33.0 pg   MCHC 33.5 32.0 - 36.0 g/dL   RDW 87.7 88.9 - 84.9 %   Platelets 244 140 - 400 Thousand/uL   MPV 10.5 7.5 - 12.5 fL   Neutro Abs 2,539 1,500 - 7,800 cells/uL    Absolute Lymphocytes 874 850 - 3,900 cells/uL   Absolute Monocytes 339 200 - 950 cells/uL   Eosinophils Absolute 109 15 - 500 cells/uL   Basophils Absolute 39 0 - 200 cells/uL   Neutrophils Relative % 65.1 %   Total Lymphocyte 22.4 %   Monocytes Relative 8.7 %   Eosinophils Relative 2.8 %   Basophils Relative 1.0 %  Hemoglobin A1c   Collection Time: 09/16/24  9:52 AM  Result Value Ref Range   Hgb A1c MFr Bld 6.3 (H) <5.7 %   Mean Plasma Glucose 134 mg/dL   eAG (mmol/L) 7.4 mmol/L  Iron, TIBC and Ferritin Panel   Collection Time: 09/16/24  9:52 AM  Result Value Ref Range   Iron 57 45 - 160 mcg/dL   TIBC 666 749 - 549 mcg/dL (calc)   %SAT 17 16 - 45 % (calc)   Ferritin 72 16 - 288 ng/mL      Assessment & Plan:   Problem List Items Addressed This Visit     Diabetes mellitus type 2, insulin  dependent (HCC) - Primary   Relevant Medications   Accu-Chek Softclix Lancets lancets   ONETOUCH VERIO test strip  losartan (COZAAR) 50 MG tablet   Essential hypertension   Relevant Medications   losartan (COZAAR) 50 MG tablet   Osteoporosis   Other Visit Diagnoses       Vitamin B12 deficiency         Dysphagia, unspecified type       Relevant Orders   Ambulatory referral to ENT     Long-term current use of injectable noninsulin antidiabetic medication            Dysphagia Intermittent dysphagia with sensation of air trapped in throat and cannot swallow, especially postprandial at night.  Prior EGD Endoscopy 2020 [facility] see above, normal esophagus and stomach. Family history of esophageal cancer raises concern for esophageal or throat pathology. - She already has GI consultation in January 2026 to discuss possible EGD and work up further - Agreed for other opinion at this time given some of her localized throat / neck symptoms - Referred to ENT for other evaluation of her throat symptoms  Type 2 diabetes mellitus Well-controlled with A1c at 6.3, improved from 6.7. - Continue  current diabetes management plan.  Essential hypertension Blood pressure elevated at 150/88, possibly due to pain during measurement. Current medications include losartan 25 mg and hydrochlorothiazide  12.5 mg. - Increased losartan to 50 mg daily. - Monitor blood pressure at home and report readings.  Age-related osteoporosis Osteoporosis managed with vitamin D supplementation. Current vitamin D level is 43, within normal range. - Continue current vitamin D supplementation.  Mild vitamin B12 deficiency Vitamin B12 level at 305, technically normal but may contribute to fatigue. - Start vitamin B12 supplementation at 1000 mcg daily. - Monitor energy levels and fatigue over the next three months.        Orders Placed This Encounter  Procedures   Ambulatory referral to ENT    Referral Priority:   Routine    Referral Type:   Consultation    Referral Reason:   Specialty Services Required    Requested Specialty:   Otolaryngology    Number of Visits Requested:   1    Meds ordered this encounter  Medications   Accu-Chek Softclix Lancets lancets    Sig: Use to check blood glucose up to 1x daily    Dispense:  100 each    Refill:  12   ONETOUCH VERIO test strip    Sig: Use to check blood sugar up to 2 to 3 x per day    Dispense:  200 each    Refill:  3    OneTouch Verio Test Strips, E11.9, Z79.4. Patient requesting to fill now, earlier than next Thursday. Updated instructions now 2 to 3 times per day.   losartan (COZAAR) 50 MG tablet    Sig: Take 1 tablet (50 mg total) by mouth daily.    Dispense:  90 tablet    Refill:  1    Dose inc from 25 to 50mg     Follow up plan: Return in about 3 months (around 12/24/2024) for 3 month DM A1c, ENT/GI updates.  Marsa Officer, DO [facility] 09/23/2024, 11:13 AM

## 2024-09-23 NOTE — Patient Instructions (Addendum)
 Thank you for coming to the office today.  Vitamin B12 305 lab result, goal is >400 for optimal Okay to add Vitamin B12 1000mcg daily supplement for 90 days  Iron level is normal range currently No further oral iron supplement required  Recent Labs    03/18/24 1043 06/17/24 0833 09/16/24 0952  HGBA1C 6.1* 6.7* 6.3*     Please schedule a Follow-up Appointment to: Return in about 3 months (around 12/24/2024) for 3 month DM A1c, ENT/GI updates.  If you have any other questions or concerns, please feel free to call the office or send a message through MyChart. You may also schedule an earlier appointment if necessary.  Additionally, you may be receiving a survey about your experience at our office within a few days to 1 week by e-mail or mail. We value your feedback.  Marsa Officer, DO [facility], [place]

## 2024-09-25 DIAGNOSIS — G4733 Obstructive sleep apnea (adult) (pediatric): Secondary | ICD-10-CM | POA: Diagnosis not present

## 2024-10-08 ENCOUNTER — Encounter (INDEPENDENT_AMBULATORY_CARE_PROVIDER_SITE_OTHER): Payer: Self-pay | Admitting: Otolaryngology

## 2024-10-08 ENCOUNTER — Ambulatory Visit (INDEPENDENT_AMBULATORY_CARE_PROVIDER_SITE_OTHER): Admitting: Otolaryngology

## 2024-10-08 VITALS — BP 149/83 | HR 74 | Ht 68.0 in | Wt 194.0 lb

## 2024-10-08 DIAGNOSIS — R1312 Dysphagia, oropharyngeal phase: Secondary | ICD-10-CM | POA: Diagnosis not present

## 2024-10-08 DIAGNOSIS — K219 Gastro-esophageal reflux disease without esophagitis: Secondary | ICD-10-CM | POA: Diagnosis not present

## 2024-10-08 MED ORDER — PANTOPRAZOLE SODIUM 40 MG PO TBEC: 40.0000 mg | DELAYED_RELEASE_TABLET | Freq: Two times a day (BID) | ORAL | 1 refills | Status: DC

## 2024-10-08 NOTE — Progress Notes (Signed)
 Reason for Consult: Dysphagia Referring Physician: Dr.Karamalegos  Brittany Cervantes is an 73 y.o. female.  HPI: History of problems with swallowing.  She has had this for years.  She has seen previous otolaryngology and apparently had a scan.  She was tried years ago on reflux medication.  She is not on it currently.  She has a feeling like her burps stick and do not come up or down.  She has a little bit of swallowing issue.  If she pushes on the right side of her neck at the level of the cricoid it causes her some discomfort but she does not have any pain with swallowing.  She has no change in her voice.  She does have mucus feeling in the throat, throat clearing, and the swallowing issue as above.  Past Medical History:  Diagnosis Date   Diabetes mellitus without complication (HCC)    Fatty liver    Fibromyalgia    Heart murmur    Hypercholesteremia    Hypertension    Migraines    Neuropathy    Osteoporosis    Sleep apnea     Past Surgical History:  Procedure Laterality Date   CATARACT EXTRACTION Bilateral    CESAREAN SECTION  1984   CHOLECYSTECTOMY  1982   KNEE ARTHROSCOPY Left    LYMPH NODE BIOPSY  2005   tonsillectony     TOTAL HIP ARTHROPLASTY Left    2007   TOTAL HIP ARTHROPLASTY Right    2014    No family history on file.  Social History:  reports that she has quit smoking. Her smoking use included cigarettes. She has never used smokeless tobacco. She reports current alcohol  use. She reports that she does not use drugs.  Allergies:  Allergies  Allergen Reactions   Covid-19 (Mrna) Vaccine Anaphylaxis   Duloxetine Hcl Other (See Comments)   Iodinated Contrast Media Hives and Other (See Comments)   Lidocaine Swelling   Other Hives    CAT SCAN Dye-hives  Steroid shot-fatigue  CAT SCAN Dye-hives  Steroid shot-fatigue  CAT SCAN Dye-hives  Steroid shot-fatigue  CAT SCAN Dye-hives  Steroid shot-fatigue   Duloxetine     bradycardia   Fentanyl     Propofol    Sevoflurane     Medications: I have reviewed the patient's current medications.  No results found for this or any previous visit (from the past 48 hours).  No results found.  ROS There were no vitals taken for this visit. Physical Exam Constitutional:      Appearance: Normal appearance.  HENT:     Head: Normocephalic and atraumatic.     Right Ear: Tympanic membrane is without lesions and middle ear aerated, ear canal and external ear normal.     Left Ear: Tympanic membrane is without lesions and middle ear aerated, ear canal and external ear normal.     Nose: Nose normal. Turbinates with mild hypertrophy, No significant swelling or masses.     Oral cavity/oropharynx: Mucous membranes are moist. No lesions or masses    Larynx: normal voice. Mirror attempted without success    Eyes:     Extraocular Movements: Extraocular movements intact.     Conjunctiva/sclera: Conjunctivae normal.     Pupils: Pupils are equal, round, and reactive to light.  Cardiovascular:     Rate and Rhythm: Normal rate.  Pulmonary:     Effort: Pulmonary effort is normal.  Musculoskeletal:     Cervical back: Normal range of motion and neck supple.  No rigidity.  Lymphadenopathy:     Cervical: No cervical adenopathy or masses.salivary glands without lesions. .  Neurological:     Mental Status: He is alert. CN 2-12 intact. No nystagmus  Flexible fibroptic laryngoscopy  Patient was informed of risks, benefits, and options. All questions answered. Consent obtained.   The scope was passed through the nose and tracked into the nasopharynx. The nasopharynx without lesions or masses. The scope was positioned over the base of tongue and epiglottis. There was no obvious lesions or significant swelling and any of the laryngeal or pharyngeal structures. The vocal cords move well but there is thickening of both arytenoids and a slight erythema.  Both vocal cords also are slightly thickened.  Where she presses  that causes some of her discomfort there is no lesions or evidence of problem. 2 The subglottis has minimal visualization but no lesions identified. The scope was removed without difficulty and patient tolerated well.      Assessment/Plan: Dysphagia/LPR-I think it definitely sounds like she has a reflux problem.  I am going to call in twice daily Protonix to treat her for 1 month to see if this gives her benefit.  She also will get a barium swallow.  She has a appointment with GI in January.  Brittany Cervantes Notice 10/08/2024, 11:05 AM

## 2024-10-09 ENCOUNTER — Encounter: Payer: Self-pay | Admitting: Family Medicine

## 2024-10-21 ENCOUNTER — Telehealth: Payer: Self-pay

## 2024-10-21 DIAGNOSIS — Z23 Encounter for immunization: Secondary | ICD-10-CM

## 2024-10-21 MED ORDER — SHINGRIX 50 MCG/0.5ML IM SUSR: INTRAMUSCULAR | 0 refills | Status: DC

## 2024-10-21 NOTE — Telephone Encounter (Signed)
 Copied from CRM [id number]. Topic: Clinical - Prescription Issue >> Oct 21, 2024 11:49 AM Delon HERO wrote: Reason for CRM: Lorn calling from [facility] is calling to request the quantity to be changed from .05  to 1.0 for SHINGRIX  injection [[id number]]

## 2024-10-21 NOTE — Addendum Note (Signed)
 Addended by: EDMAN MARSA PARAS on: 10/21/2024 11:57 AM   Modules accepted: Orders

## 2024-10-21 NOTE — Telephone Encounter (Signed)
New rx sent to pharmacy as requested.

## 2024-11-11 DIAGNOSIS — E119 Type 2 diabetes mellitus without complications: Secondary | ICD-10-CM

## 2024-11-11 DIAGNOSIS — I10 Essential (primary) hypertension: Secondary | ICD-10-CM

## 2024-11-11 DIAGNOSIS — Z23 Encounter for immunization: Secondary | ICD-10-CM

## 2024-11-12 ENCOUNTER — Encounter (INDEPENDENT_AMBULATORY_CARE_PROVIDER_SITE_OTHER): Payer: Self-pay

## 2024-11-12 ENCOUNTER — Ambulatory Visit (INDEPENDENT_AMBULATORY_CARE_PROVIDER_SITE_OTHER): Admitting: Otolaryngology

## 2024-11-12 MED ORDER — SHINGRIX 50 MCG/0.5ML IM SUSR: INTRAMUSCULAR | 0 refills | Status: AC

## 2024-11-12 MED ORDER — LOSARTAN POTASSIUM 100 MG PO TABS: 100.0000 mg | ORAL_TABLET | Freq: Every day | ORAL | 1 refills | Status: AC

## 2024-11-12 MED ORDER — MOUNJARO 10 MG/0.5ML ~~LOC~~ SOAJ: 10.0000 mg | SUBCUTANEOUS | 2 refills | Status: AC

## 2024-11-12 NOTE — Addendum Note (Signed)
 Addended by: EDMAN MARSA PARAS on: 11/12/2024 06:17 PM   Modules accepted: Orders

## 2024-11-13 ENCOUNTER — Ambulatory Visit (INDEPENDENT_AMBULATORY_CARE_PROVIDER_SITE_OTHER): Admitting: Family Medicine

## 2024-11-13 ENCOUNTER — Encounter: Payer: Self-pay | Admitting: Family Medicine

## 2024-11-13 VITALS — BP 120/72 | HR 62 | Ht 68.0 in

## 2024-11-13 DIAGNOSIS — B354 Tinea corporis: Secondary | ICD-10-CM | POA: Insufficient documentation

## 2024-11-13 MED ORDER — CLOTRIMAZOLE-BETAMETHASONE 1-0.05 % EX CREA: 1.0000 | TOPICAL_CREAM | Freq: Two times a day (BID) | CUTANEOUS | 0 refills | Status: AC

## 2024-11-13 NOTE — Progress Notes (Signed)
 "    Primary Care / Sports Medicine Office Visit  Patient Information:  Patient ID: Brittany Cervantes, female DOB: 1951-08-03 Age: 74 y.o. MRN: [id number]   Brittany Cervantes is a pleasant 74 y.o. female presenting with the following:  Chief Complaint  Patient presents with   Rash    Red scaly round spot on posterior aspect of bil knee unaware of how long spots have been there.     Vitals:   11/13/24 1042  BP: 120/72  Pulse: 62  SpO2: 97%   Vitals:   11/13/24 1042  Height: 5' 8 (1.727 m)   Body mass index is 29.5 kg/m.  No results found.   Discussed the use of AI scribe software for clinical note transcription with the patient, who gave verbal consent to proceed.   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  Cutaneous lesions of posterior knees - Sudden onset of one lesion on the posterior aspect of each knee, first observed this morning while showering - Lesions are non-pruritic, non-painful, and without irritation - Unable to visualize the lesions herself; uncertain of duration due to lack of routine inspection of the area - Husband observed that one lesion differed in shape and had a central area - No palpation for tenderness performed - No prior similar lesions or history of tinea or other fungal infections in this location  Recent exposures and topical agents - Recently wore a new pair of unwashed pants - Applied hypoallergenic, fragrance-free lotion to the back of her legs few days prior - No introduction of new soaps, perfumes, or laundry detergents - Has  dogs at home but did not specify direct contact with the affected area  Chronic dermatitis of anterior lower legs - Longstanding pruritic, scaly dermatitis localized to the anterior lower legs - Symptoms worsen with dryness - Managed with triamcinolone  cream and a stronger topical agent prescribed by dermatology - Current lesions differ from usual dermatitis in location (posterior knees) and are non-pruritic  Physical Exam SKIN: Lesions on the popliteal fossae, one on each leg, with varying shapes, suggestive of tinea corporis. (See images)   Media Information  Document Information  Photographic Image: Photos  Left posterior knee  11/13/2024 11:05  Attached To:  Office Visit on 11/13/24 with Alvia Selinda PARAS, MD  Source Information  Alvia Selinda PARAS, MD  [facility]    Media Information  Document Information  Photographic Image: Photos  Right posterior knee  11/13/2024 11:06  Attached To:  Office Visit on 11/13/24 with Alvia Selinda PARAS, MD  Source Information  Alvia Selinda PARAS, MD  [facility]    Assessment and Plan Tinea corporis New, well-demarcated lesions on posterior knees consistent with tinea corporis. Differential includes contact dermatitis and eczema. Lesions uncomplicated and asymptomatic. - Obtained clinical images and measurements for documentation and comparison. - Prescribed topical therapy with antifungal and corticosteroid. - Advised monitoring lesion improvement over the next week. - Instructed to contact via MyChart if lesions fail to improve, worsen, or recur for possible dermatology referral. - Provided education on tinea corporis, heat and moisture  role, and recommended loose clothing to reduce recurrence risk. - No formal follow-up; self-monitor using documented images and measurements in MyChart.      Relevant Medications   clotrimazole -betamethasone  (LOTRISONE ) cream     Orders & Medications Medications:  Meds  ordered this encounter  Medications   clotrimazole -betamethasone  (LOTRISONE ) cream    Sig: Apply 1 Application topically 2 (two) times daily for 14 days. Can discontinue once resolved.    Dispense:  45 g    Refill:  0   No orders of the defined types were placed in this encounter.    No follow-ups on file.     [name], MD, [facility]   Primary Care Sports Medicine [facility]   "

## 2024-11-13 NOTE — Assessment & Plan Note (Signed)
 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  Cutaneous lesions of posterior knees - Sudden onset of one lesion on the posterior aspect of each knee, first observed this morning while showering - Lesions are non-pruritic, non-painful, and without irritation - Unable to visualize the lesions herself; uncertain of duration due to lack of routine inspection of the area - Husband observed that one lesion differed in shape and had a central area - No palpation for tenderness performed - No prior similar lesions or history of tinea or other fungal infections in this location  Recent exposures and topical agents - Recently wore a new pair of unwashed pants - Applied hypoallergenic, fragrance-free lotion to the back of her legs few days prior - No introduction of new soaps, perfumes, or laundry detergents - Has dogs at home but did not specify direct contact with the affected area  Chronic dermatitis of anterior lower legs - Longstanding pruritic, scaly dermatitis localized to the anterior lower legs - Symptoms worsen with dryness - Managed with triamcinolone  cream and a stronger topical agent prescribed by dermatology - Current lesions differ from usual dermatitis in location (posterior knees) and are non-pruritic  Physical Exam SKIN: Lesions on the popliteal fossae, one on each leg, with varying shapes, suggestive of tinea corporis. (See images)   Media Information  Document Information  Photographic Image: Photos  Left posterior knee  11/13/2024 11:05  Attached To:  Office Visit on 11/13/24 with Alvia Selinda PARAS, MD  Source Information  Alvia Selinda PARAS, MD  [facility]    Media Information  Document Information  Photographic Image: Photos  Right posterior knee  11/13/2024 11:06  Attached To:  Office Visit  on 11/13/24 with Alvia Selinda PARAS, MD  Source Information  Alvia Selinda PARAS, MD  [facility]    Assessment and Plan Tinea corporis New, well-demarcated lesions on posterior knees consistent with tinea corporis. Differential includes contact dermatitis and eczema. Lesions uncomplicated and asymptomatic. - Obtained clinical images and measurements for documentation and comparison. - Prescribed topical therapy with antifungal and corticosteroid. - Advised monitoring lesion improvement over the next week. - Instructed to contact via MyChart if lesions fail to improve, worsen, or recur for possible dermatology referral. - Provided education on tinea corporis, heat and moisture role, and recommended loose clothing to reduce recurrence risk. - No formal follow-up; self-monitor using documented images and measurements in MyChart.

## 2024-11-14 NOTE — Progress Notes (Signed)
 Brittany Cervantes                                          MRN: [id number]   11/14/2024   The [facility] Quality Team Specialist reviewed this patient medical record for the purposes of chart review for care gap closure. The following were reviewed: abstraction for care gap closure-controlling blood pressure.    [facility] Quality Team

## 2024-11-21 ENCOUNTER — Other Ambulatory Visit: Payer: Self-pay | Admitting: Family Medicine

## 2024-11-21 DIAGNOSIS — Z23 Encounter for immunization: Secondary | ICD-10-CM

## 2024-11-22 NOTE — Telephone Encounter (Signed)
 2nd dose already ordered. Requested Prescriptions  Refused Prescriptions Disp Refills   SHINGRIX  injection [Pharmacy Med Name: SHINGRIX  50 MCG/0.5ML IM SUSP] 1 each     Sig: USE AS DIRECTED.     Off-Protocol Failed - 11/22/2024 10:52 AM      Failed - Medication not assigned to a protocol, review manually.      Passed - Valid encounter within last 12 months    Recent Outpatient Visits           1 week ago Tinea corporis   [facility], Selinda PARAS, MD   2 months ago Diabetes mellitus type 2, insulin  dependent [facility])   [facility] [place], Marsa PARAS, DO   3 months ago Stasis dermatitis of both legs   [place] [facility] [place], Marsa PARAS, DO   5 months ago Annual physical exam   [facility] [place] Marsa PARAS, DO   7 months ago Acute non-recurrent frontal sinusitis   [facility] [place], Marsa PARAS, [place]

## 2024-11-28 ENCOUNTER — Ambulatory Visit: Attending: Neurology | Admitting: Physical Therapy

## 2024-11-28 ENCOUNTER — Other Ambulatory Visit (INDEPENDENT_AMBULATORY_CARE_PROVIDER_SITE_OTHER): Payer: Self-pay | Admitting: Otolaryngology

## 2024-11-28 ENCOUNTER — Encounter: Payer: Self-pay | Admitting: Physical Therapy

## 2024-11-28 DIAGNOSIS — R262 Difficulty in walking, not elsewhere classified: Secondary | ICD-10-CM | POA: Insufficient documentation

## 2024-11-28 DIAGNOSIS — R2689 Other abnormalities of gait and mobility: Secondary | ICD-10-CM | POA: Diagnosis present

## 2024-11-28 DIAGNOSIS — M6281 Muscle weakness (generalized): Secondary | ICD-10-CM | POA: Insufficient documentation

## 2024-11-28 NOTE — Therapy (Signed)
 " OUTPATIENT PHYSICAL THERAPY BALANCE EVALUATION   Patient Name: Brittany Cervantes MRN: [id number] DOB:Nov 14, 1950, 74 y.o., female Today's Date: 11/28/2024   PT End of Session - 11/28/24 1030     Visit Number 1    Number of Visits 17    Date for Recertification  02/06/25    Authorization Type [employer] [id number]    PT Start Time 1031    PT Stop Time 1118    PT Time Calculation (min) 47 min    Equipment Utilized During Treatment Gait belt    Behavior During Therapy WFL for tasks assessed/performed          Past Medical History:  Diagnosis Date   Diabetes mellitus without complication (HCC)    Fatty liver    Fibromyalgia    Heart murmur    Hypercholesteremia    Hypertension    Migraines    Neuropathy    Osteoporosis    Sleep apnea    Past Surgical History:  Procedure Laterality Date   CATARACT EXTRACTION Bilateral    CESAREAN SECTION  1984   CHOLECYSTECTOMY  1982   KNEE ARTHROSCOPY Left    LYMPH NODE BIOPSY  2005   tonsillectony     TOTAL HIP ARTHROPLASTY Left    2007   TOTAL HIP ARTHROPLASTY Right    2014   Patient Active Problem List   Diagnosis Date Noted   Tinea corporis 11/13/2024   Stasis dermatitis of both legs 03/18/2024   Fibromyalgia 12/21/2023   CPAP use counseling 05/01/2023   Restless leg syndrome 05/01/2023   Osteoporosis 10/06/2022   Osteoarthritis of ankle and foot 10/06/2022   Diabetes mellitus type 2, insulin  dependent (HCC) 05/11/2021   Fatty liver 05/11/2021   Major depressive disorder, recurrent, in full remission 05/11/2021   OSA (obstructive sleep apnea) 03/29/2021   Bradycardia 03/03/2021   Gastroesophageal reflux disease 11/18/2020   Seasonal allergic rhinitis due to pollen 11/18/2020   Thyroid  nodule 05/27/2019   Osteoarthritis 11/13/2018   Arthralgia 08/23/2018   Essential hypertension 08/01/2016    PCP: Marsa JINNY Officer, DO  REFERRING PROVIDER: Arthea FORBES Farrow, MD  REFERRING DIAGNOSIS: R27.8 (ICD-10-CM) - Sensory ataxia    THERAPY DIAG: Other abnormalities of gait and mobility  Difficulty in walking, not elsewhere classified  Imbalance  Muscle weakness (generalized)   ONSET DATE: 11/2023  FOLLOW UP APPT WITH PROVIDER: Yes ; 04/22/25 neurology follow-up   RATIONALE FOR EVALUATION AND TREATMENT: Rehabilitation  SUBJECTIVE:  Chief Complaint: Pt is a 74 year old female referred to PT by Dr. Lane for gait and balance in setting of RLE weakness with Hx of previous Fx R foot.  Pertinent History Pt is a 74 year old female referred to PT by Dr. Lane for gait and balance in setting of RLE weakness with Hx of previous Fx (per [facility] documentation, MR evidence of cuboid stress/insufficiency fracture and midfoot OA in 2023). Pt reports BLE weakness with RLE being worse. Hx of bilat THA. Pt denies sensory change in feet, but she reports some imbalance on decline.   Pt reports some calf discomfort at night. Pt reports notable challenge with decline and stepping down. Pt reports intermittent catching on lip in kitchen/change in surface elevation. Pt reports fibromyalgia and sometimes experiencing fibro fog. Pt reports poor grip strength, but no sensory change in upper limbs.  Pain: No Numbness/Tingling: No Focal Weakness: Yes; BLE, RLE more than LLE Recent changes in overall health/medication: No Prior history of physical therapy for balance:  Yes; previous PT for strengthening/gait Falls: Has patient fallen in last 6 months? No, Number of falls: N/A Directional pattern for falls: No  Imaging: No   Prior level of function: Independent with community mobility without device Occupational demands: Retired Hobbies: Haematologist wood, Hx of golfing  Red flags (bowel/bladder changes, saddle paresthesia, personal history of cancer,  h/o spinal tumors, h/o compression fx, h/o abdominal aneurysm, abdominal pain, chills/fever, night sweats, nausea, vomiting, unrelenting pain): Negative  Precautions: None  Weight Bearing Restrictions: No  Living Environment Lives with: lives with their spouse;  Lives in: House/apartment; 2 steps to get in; one-level home; left-hand handrail to get into home; concrete driveway to get up to home Has following equipment at home: walk-in shower   Patient Goals: Muscles to work better; able to pick feet up a bit more    OBJECTIVE:   Patient Surveys  ABC: 71.3%   Cognition Patient is oriented to person, place, and time.  Recent memory is intact.  Remote memory is intact.  Attention span and concentration are intact.  Expressive speech is intact.  Patient's fund of knowledge is within normal limits for educational level.    Gross Musculoskeletal Assessment Tremor: None Bulk: Normal Tone: Normal  GAIT: Distance walked: 80 ft Assistive device utilized: None Level of assistance: Complete Independence Comments: Mild ipsilateral sidebend with stance phase, mild R>L pelvic drop with ipsilateral stance phase   Posture: Moderate FHRS posture   AROM  AROM (Normal range in degrees) AROM  11/28/2024      Hip Right Left  Flexion (125)    Extension (15)    Abduction (40)    Adduction     Internal Rotation (45)    External Rotation (45)        Knee    Flexion (135)    Extension (0)        Ankle    Dorsiflexion (20) WFL WFL  Plantarflexion (50)    Inversion (35)    Eversion (15    (* = pain; Blank rows = not tested)   LE MMT:  MMT (out of 5) Right 11/28/2024 Left 11/28/2024  Hip flexion 4- 4-  Hip extension    Hip abduction Seated 4+ Seated 4+  Hip adduction Seated 5 Seated 5  Hip internal rotation    Hip external rotation    Knee flexion 4- 4  Knee extension 4- 4+  Ankle dorsiflexion 4 4  Ankle plantarflexion    Ankle inversion  Ankle eversion     (* = pain; Blank rows = not tested)   Sensation Deferred   Reflexes Deferred   Cranial Nerves Deferred   Coordination/Cerebellar Finger to Nose: WNL Heel to Shin: WNL Rapid alternating movements: WNL Finger Opposition: WNL; limited from 4th-5th digit mobility deficits  Pronator Drift: Mild R side  Romberg: EO WNL, EC 10 sec   FUNCTIONAL OUTCOME MEASURES   Results Comments  BERG Next visit/56   DGI Next visit/24   TUG 9.8 seconds WNL  5TSTS 23.9 seconds Fall risk, in need of invervention  (Blank rows = not tested)    TODAY'S TREATMENT    11/28/2024    Therapeutic Exercise - for HEP establishment, discussion on appropriate exercise/activity modification, PT education  Patient education on current condition, role of PT, prognosis, plan of care. Discussed strategies for creating safe home environment and decreasing fall risk.  Reviewed baseline home exercises and provided handout for [facility] program (see Access Code); tactile cueing and therapist demonstration utilized as needed for carryover of proper technique to HEP.      PATIENT EDUCATION:  Education details: see above for patient education details Person educated: Patient Education method: Explanation, Demonstration, and Handouts Education comprehension: verbalized understanding   HOME EXERCISE PROGRAM: Access Code: [email or web address] URL: [email or web address] Date: 11/28/2024 Prepared by: Venetia Endo  Exercises - Sit to Stand Without Arm Support  - 1 x daily - 4-5 x weekly - 2 sets - 10 reps - Standing Hip Abduction with Counter Support  - 1 x daily - 4-5 x weekly - 2 sets - 10 reps - Standing Hip Extension with Counter Support  - 1 x daily - 4-5 x weekly - 2 sets - 10 reps - Heel Toe Raises with Counter Support  - 1 x daily - 4-5 x weekly - 2 sets - 10 reps - Narrow Stance with Counter Support  - 1 x daily - 4-5 x weekly - 3 sets - 10 reps - 20-30sec hold   ASSESSMENT:  CLINICAL  IMPRESSION: Patient is a 74 y.o. female who was seen today for physical therapy evaluation and treatment for gait and mobility deficits and imbalance in setting of RLE weakness and Hx of old R cuboid stress fracture (pt seen by [facility] for this in 2023). Pt does not have recent fall Hx, but concern for weakness and gait instability (especially with stepping over change in surface terrain and negotiating steps/decline). Pt does not have significant neuro history, but she does attest to some nocturnal calf/lower leg discomfort consistent with restless leg syndrome. Pt has most significant deficits with negotiating steps, decline, and change in surface elevation with intermittent episodes of poor toe clearance leading to tripping; pt reports fear of falling forward with negotiating decline. Objective impairments include Abnormal gait, decreased balance, difficulty walking, decreased ROM, and decreased strength. These impairments are limiting patient from meal prep, cleaning, shopping, and community activity. Personal factors including Age, Past/current experiences, need for decreased PT frequency, Time since onset of injury/illness/exacerbation, and 3+ comorbidities: (DM, osteoporosis, neuropathy, migraines, HTN, fibromyalgia) are also affecting patient's functional outcome. Patient will benefit from skilled PT to address above impairments and improve overall function.  REHAB POTENTIAL: Good  CLINICAL DECISION MAKING: Evolving/moderate complexity  EVALUATION COMPLEXITY: Moderate   GOALS: Goals reviewed with patient? Yes  SHORT TERM GOALS: Target date: 12/19/2024  Pt will be independent with HEP in order to improve strength and balance in order to decrease fall risk and improve function at home. Baseline: 11/28/24:  Baseline HEP initiated.  Goal status: INITIAL   LONG TERM GOALS: Target date: 01/30/2025   1.  Pt will improve BERG by at least 3 points in order to demonstrate clinically significant  improvement in balance.   Baseline: 11/28/24: To be completed on visit # 2.  Goal status: INITIAL  2.  Pt will improve ABC by at least 13% in order to demonstrate clinically significant improvement in balance confidence.      Baseline: 11/28/24: 71.3% Goal status: INITIAL  3. Pt will decrease 5TSTS by at least 3 seconds in order to demonstrate clinically significant improvement in LE strength      Baseline: 11/28/24: 23.9 sec Goal status: INITIAL  4. Pt will improve DGI by at least 3 points in order to demonstrate clinically significant improvement in balance and decreased risk for falls.     Baseline: 11/28/24: To be completed on visit # 2. Goal status: INITIAL     PLAN: PT FREQUENCY: 1x/week to once every 2-3 weeks (limiting frequency to decrease financial burden)  PT DURATION: 10-12 weeks  PLANNED INTERVENTIONS: Therapeutic exercises, Therapeutic activity, Neuromuscular re-education, Balance training, Gait training, Patient/Family education, Joint manipulation, Joint mobilization, Canalith repositioning, Aquatic Therapy, Dry Needling, Cognitive remediation, Electrical stimulation, Spinal manipulation, Spinal mobilization, Cryotherapy, Moist heat, Traction, Ultrasound, Ionotophoresis 4mg /ml Dexamethasone , and Manual therapy  PLAN FOR NEXT SESSION: Complete BERG, DGI, and mCTSIB. Continue with LE strengthening, postural control training, and re-training or obstacle/step negotiation (emphasis on challenge with stepping down).   Venetia Endo, PT, DPT #[id number]  Venetia ONEIDA Endo 11/28/2024, 11:28 AM  "

## 2024-12-03 ENCOUNTER — Ambulatory Visit: Admitting: Physical Therapy

## 2024-12-03 DIAGNOSIS — R262 Difficulty in walking, not elsewhere classified: Secondary | ICD-10-CM

## 2024-12-03 DIAGNOSIS — R2689 Other abnormalities of gait and mobility: Secondary | ICD-10-CM | POA: Diagnosis not present

## 2024-12-03 DIAGNOSIS — M6281 Muscle weakness (generalized): Secondary | ICD-10-CM

## 2024-12-03 NOTE — Therapy (Signed)
 " OUTPATIENT PHYSICAL THERAPY BALANCE TREATMENT   Patient Name: Brittany Cervantes MRN: [id number] DOB:04-Nov-1951, 74 y.o., female Today's Date: 12/03/2024   PT End of Session - 12/03/24 1026     Visit Number 2    Number of Visits 17    Date for Recertification  02/06/25    Authorization Type [employer] 2026    PT Start Time 1032    PT Stop Time 1114    PT Time Calculation (min) 42 min    Equipment Utilized During Treatment Gait belt    Behavior During Therapy WFL for tasks assessed/performed          Past Medical History:  Diagnosis Date   Diabetes mellitus without complication (HCC)    Fatty liver    Fibromyalgia    Heart murmur    Hypercholesteremia    Hypertension    Migraines    Neuropathy    Osteoporosis    Sleep apnea    Past Surgical History:  Procedure Laterality Date   CATARACT EXTRACTION Bilateral    CESAREAN SECTION  1984   CHOLECYSTECTOMY  1982   KNEE ARTHROSCOPY Left    LYMPH NODE BIOPSY  2005   tonsillectony     TOTAL HIP ARTHROPLASTY Left    2007   TOTAL HIP ARTHROPLASTY Right    2014   Patient Active Problem List   Diagnosis Date Noted   Tinea corporis 11/13/2024   Stasis dermatitis of both legs 03/18/2024   Fibromyalgia 12/21/2023   CPAP use counseling 05/01/2023   Restless leg syndrome 05/01/2023   Osteoporosis 10/06/2022   Osteoarthritis of ankle and foot 10/06/2022   Diabetes mellitus type 2, insulin  dependent (HCC) 05/11/2021   Fatty liver 05/11/2021   Major depressive disorder, recurrent, in full remission 05/11/2021   OSA (obstructive sleep apnea) 03/29/2021   Bradycardia 03/03/2021   Gastroesophageal reflux disease 11/18/2020   Seasonal allergic rhinitis due to pollen 11/18/2020   Thyroid  nodule 05/27/2019   Osteoarthritis 11/13/2018   Arthralgia 08/23/2018   Essential hypertension 08/01/2016    PCP: Marsa JINNY Officer, DO  REFERRING PROVIDER: Arthea FORBES Farrow, MD  REFERRING DIAGNOSIS: R27.8 (ICD-10-CM) - Sensory ataxia    THERAPY DIAG: Other abnormalities of gait and mobility  Difficulty in walking, not elsewhere classified  Imbalance  Muscle weakness (generalized)   ONSET DATE: 11/2023  FOLLOW UP APPT WITH PROVIDER: Yes ; 04/22/25 neurology follow-up   RATIONALE FOR EVALUATION AND TREATMENT: Rehabilitation                                                                                                                                                                                PERTINENT HISTORY: Pt is a 74 year old  female referred to PT by Dr. Lane for gait and balance in setting of RLE weakness with Hx of previous Fx (per [facility] documentation, MR evidence of cuboid stress/insufficiency fracture and midfoot OA in 2023). Pt reports BLE weakness with RLE being worse. Hx of bilat THA. Pt denies sensory change in feet, but she reports some imbalance on decline.   Pt reports some calf discomfort at night. Pt reports notable challenge with decline and stepping down. Pt reports intermittent catching on lip in kitchen/change in surface elevation. Pt reports fibromyalgia and sometimes experiencing fibro fog. Pt reports poor grip strength, but no sensory change in upper limbs.  Pain: No Numbness/Tingling: No Focal Weakness: Yes; BLE, RLE more than LLE Recent changes in overall health/medication: No Prior history of physical therapy for balance:  Yes; previous PT for strengthening/gait Falls: Has patient fallen in last 6 months? No, Number of falls: N/A Directional pattern for falls: No  Imaging: No   Prior level of function: Independent with community mobility without device Occupational demands: Retired Hobbies: Carving wood, Hx of golfing  Red flags (bowel/bladder changes, saddle paresthesia, personal history of cancer, h/o spinal tumors, h/o compression fx, h/o abdominal aneurysm, abdominal pain, chills/fever, night sweats, nausea, vomiting, unrelenting pain): Negative  Precautions:  None  Weight Bearing Restrictions: No  Living Environment Lives with: lives with their spouse;  Lives in: House/apartment; 2 steps to get in; one-level home; left-hand handrail to get into home; concrete driveway to get up to home Has following equipment at home: walk-in shower   Patient Goals: Muscles to work better; able to pick feet up a bit more    OBJECTIVE (data from initial evaluation unless otherwise dated):   Patient Surveys  ABC: 71.3%  GAIT: Distance walked: 80 ft Assistive device utilized: None Level of assistance: Complete Independence Comments: Mild ipsilateral sidebend with stance phase, mild R>L pelvic drop with ipsilateral stance phase  Posture: Moderate FHRS posture  AROM  AROM (Normal range in degrees) AROM  11/28/2024      Ankle    Dorsiflexion (20) WFL WFL  Plantarflexion (50)    Inversion (35)    Eversion (15    (* = pain; Blank rows = not tested)   LE MMT:  MMT (out of 5) Right 11/28/2024 Left 11/28/2024  Hip flexion 4- 4-  Hip extension    Hip abduction Seated 4+ Seated 4+  Hip adduction Seated 5 Seated 5  Hip internal rotation    Hip external rotation    Knee flexion 4- 4  Knee extension 4- 4+  Ankle dorsiflexion 4 4  Ankle plantarflexion    Ankle inversion    Ankle eversion    (* = pain; Blank rows = not tested)   Coordination/Cerebellar Finger to Nose: WNL Heel to Shin: WNL Rapid alternating movements: WNL Finger Opposition: WNL; limited from 4th-5th digit mobility deficits  Pronator Drift: Mild R side  Romberg: EO WNL, EC 10 sec   FUNCTIONAL OUTCOME MEASURES *Updated 12/03/24  Results Comments  BERG 46/56 Above cut-off, moderate risk per BERG interpretation  DGI 14/24 Below cut-off score of 19/24  TUG 9.8 seconds WNL  5TSTS 23.9 seconds Fall risk, in need of invervention  (Blank rows = not tested)   Clinical Test of Sensory Interaction for Balance    (CTSIB): *Completed 12/03/24 CONDITION TIME STRATEGY SWAY   Eyes open, firm surface 30 seconds    Eyes closed, firm surface 30 seconds  Mild increased quiet standing sway  Eyes open, foam surface  30 seconds ankle Moderate increase  Eyes closed, foam surface 30 seconds ankle Significant forward/backward sway; primarily ankle strategy      TODAY'S TREATMENT    12/03/2024   SUBJECTIVE STATEMENT:   Pt reports some soreness with completion of exercises due to limited exercise/activity in recent years. Pt also has comorbid upper quarter pain with Hx of paresthesias and LUE motor impairments when neck/upper limb pain flares up. She reports trying her HEP one time - she had family in her home over the weekend during inclement weather.    Neuromuscular Re-education - for improved sensory integration, static and dynamic postural control, equilibrium and non-equilibrium coordination as needed for negotiating home and community environment and stair stepping  BERG, DGI testing (see below) Complete mCTSIB (see above)   [facility] PT Assessment - 12/03/24 0001       Standardized Balance Assessment   Standardized Balance Assessment Berg Balance Test;Dynamic Gait Index      Berg Balance Test   Sit to Stand Able to stand without using hands and stabilize independently    Standing Unsupported Able to stand safely 2 minutes    Sitting with Back Unsupported but Feet Supported on Floor or Stool Able to sit safely and securely 2 minutes    Stand to Sit Sits safely with minimal use of hands    Transfers Able to transfer safely, minor use of hands    Standing Unsupported with Eyes Closed Able to stand 10 seconds with supervision    Standing Unsupported with Feet Together Able to place feet together independently and stand for 1 minute with supervision    From Standing, Reach Forward with Outstretched Arm Can reach confidently >25 cm (10)    From Standing Position, Pick up Object from Floor Able to pick up shoe, needs supervision    From Standing Position, Turn to  Look Behind Over each Shoulder Looks behind from both sides and weight shifts well    Turn 360 Degrees Able to turn 360 degrees safely one side only in 4 seconds or less    Standing Unsupported, Alternately Place Feet on Step/Stool Able to complete 4 steps without aid or supervision    Standing Unsupported, One Foot in Front Able to take small step independently and hold 30 seconds    Standing on One Leg Able to lift leg independently and hold equal to or more than 3 seconds    Total Score 46      Dynamic Gait Index   Level Surface Moderate Impairment    Change in Gait Speed Mild Impairment    Gait with Horizontal Head Turns Mild Impairment    Gait with Vertical Head Turns Mild Impairment    Gait and Pivot Turn Moderate Impairment    Step Over Obstacle Mild Impairment    Step Around Obstacles Normal    Steps Moderate Impairment    Total Score 14         Toe tapping, 6-inch step; in // bars; 2 x 10 alternating R/L  Feet together, with head turns; horiz and vertical; x 20 reps each alt R/L   PATIENT EDUCATION: Discussed role of fitness components (strength, power, endurance) for reducing fall risk and benefit of use of glider aerobic exercise machine at home.     PATIENT EDUCATION:  Education details: see above for patient education details Person educated: Patient Education method: Explanation, Demonstration, and Handouts Education comprehension: verbalized understanding   HOME EXERCISE PROGRAM: Access Code: [email or web address] URL: [email or web address] Date: 12/03/2024 Prepared by:  Venetia Endo  Exercises - Sit to Stand Without Arm Support  - 1 x daily - 4-5 x weekly - 2 sets - 10 reps - Standing Hip Abduction with Counter Support  - 1 x daily - 4-5 x weekly - 2 sets - 10 reps - Standing Hip Extension with Counter Support  - 1 x daily - 4-5 x weekly - 2 sets - 10 reps - Heel Toe Raises with Counter Support  - 1 x daily - 4-5 x weekly - 2 sets - 10 reps - Corner  Balance Feet Together: Eyes Open With Head Turns  - 2 x daily - 4-5 x weekly - 2 sets - 10 reps - Step Taps on Low Step  - 2 x daily - 4-5 x weekly - 2 sets - 10 reps   ASSESSMENT:  CLINICAL IMPRESSION: Patient has BERG just above cut-off for fall risk (45), but she is in moderate fall risk category per BERG balance scale. She has DGI well below fall risk cut-off score. Patient's HEP was updated for weight shifting and equilibrium coordination training and standing balance with secondary task (head turns). Patient tolerates session relatively well with most of the time spent on further fall risk and postural control testing. Patient will benefit from skilled PT to address above impairments and improve overall function.  REHAB POTENTIAL: Good  CLINICAL DECISION MAKING: Evolving/moderate complexity  EVALUATION COMPLEXITY: Moderate   GOALS: Goals reviewed with patient? Yes  SHORT TERM GOALS: Target date: 12/19/2024  Pt will be independent with HEP in order to improve strength and balance in order to decrease fall risk and improve function at home. Baseline: 11/28/24: Baseline HEP initiated.  Goal status: INITIAL   LONG TERM GOALS: Target date: 01/30/2025   1.  Pt will improve BERG by at least 3 points in order to demonstrate clinically significant improvement in balance.   Baseline: 11/28/24: To be completed on visit # 2.       12/03/24: 46/56 Goal status: INITIAL  2.  Pt will improve ABC by at least 13% in order to demonstrate clinically significant improvement in balance confidence.      Baseline: 11/28/24: 71.3% Goal status: INITIAL  3. Pt will decrease 5TSTS by at least 3 seconds in order to demonstrate clinically significant improvement in LE strength      Baseline: 11/28/24: 23.9 sec Goal status: INITIAL  4. Pt will improve DGI by at least 3 points in order to demonstrate clinically significant improvement in balance and decreased risk for falls.     Baseline: 11/28/24: To be  completed on visit # 2.       12/03/24: 14/24 Goal status: INITIAL     PLAN: PT FREQUENCY: 1x/week to once every 2-3 weeks (limiting frequency to decrease financial burden)  PT DURATION: 10-12 weeks  PLANNED INTERVENTIONS: Therapeutic exercises, Therapeutic activity, Neuromuscular re-education, Balance training, Gait training, Patient/Family education, Joint manipulation, Joint mobilization, Canalith repositioning, Aquatic Therapy, Dry Needling, Cognitive remediation, Electrical stimulation, Spinal manipulation, Spinal mobilization, Cryotherapy, Moist heat, Traction, Ultrasound, Ionotophoresis 4mg /ml Dexamethasone , and Manual therapy  PLAN FOR NEXT SESSION:  Continue with LE strengthening, postural control training, and re-training for obstacle/step negotiation (emphasis on challenge with stepping down).   Venetia Endo, PT, DPT #[id number]  Venetia ONEIDA Endo 12/03/2024, 11:26 AM  "

## 2024-12-05 ENCOUNTER — Ambulatory Visit: Admitting: Physical Therapy

## 2024-12-10 ENCOUNTER — Ambulatory Visit: Admitting: Physical Therapy

## 2024-12-11 ENCOUNTER — Encounter: Payer: Self-pay | Admitting: Family Medicine

## 2024-12-12 ENCOUNTER — Other Ambulatory Visit

## 2024-12-17 ENCOUNTER — Ambulatory Visit: Admission: RE | Admit: 2024-12-17 | Source: Home / Self Care | Admitting: Gastroenterology

## 2024-12-17 ENCOUNTER — Encounter: Admission: RE | Payer: Self-pay | Source: Home / Self Care

## 2024-12-17 ENCOUNTER — Ambulatory Visit (INDEPENDENT_AMBULATORY_CARE_PROVIDER_SITE_OTHER): Admitting: Otolaryngology

## 2024-12-19 ENCOUNTER — Ambulatory Visit: Admitting: Physical Therapy

## 2024-12-23 ENCOUNTER — Ambulatory Visit: Admitting: Physical Therapy

## 2024-12-24 ENCOUNTER — Ambulatory Visit: Admitting: Physical Therapy

## 2024-12-24 ENCOUNTER — Ambulatory Visit: Admitting: Family Medicine

## 2024-12-26 ENCOUNTER — Ambulatory Visit: Admitting: Physical Therapy

## 2024-12-31 ENCOUNTER — Ambulatory Visit: Admitting: Physical Therapy

## 2025-01-02 ENCOUNTER — Ambulatory Visit: Admitting: Physical Therapy

## 2025-01-07 ENCOUNTER — Ambulatory Visit: Admitting: Physical Therapy

## 2025-01-09 ENCOUNTER — Other Ambulatory Visit

## 2025-01-14 ENCOUNTER — Ambulatory Visit (INDEPENDENT_AMBULATORY_CARE_PROVIDER_SITE_OTHER): Admitting: Otolaryngology

## 2025-01-15 ENCOUNTER — Ambulatory Visit: Admitting: Physical Therapy
